# Patient Record
Sex: Female | Born: 1975 | Race: Black or African American | Hispanic: No | Marital: Single | Smoking: Never smoker
Health system: Southern US, Community
[De-identification: ages and names within clinical notes are randomized; demographics above are authoritative.]

## PROBLEM LIST (undated history)

## (undated) DIAGNOSIS — D1803 Hemangioma of intra-abdominal structures: Secondary | ICD-10-CM

## (undated) DIAGNOSIS — E278 Other specified disorders of adrenal gland: Secondary | ICD-10-CM

## (undated) HISTORY — PX: BREAST SURGERY: SHX581

## (undated) HISTORY — PX: APPENDECTOMY: SHX54

## (undated) HISTORY — PX: BILATERAL OOPHORECTOMY: SHX1221

## (undated) HISTORY — PX: ABDOMINAL HYSTERECTOMY: SHX81

## (undated) HISTORY — DX: Other specified disorders of adrenal gland: E27.8

---

## 2000-03-25 ENCOUNTER — Other Ambulatory Visit: Admission: RE | Admit: 2000-03-25 | Discharge: 2000-03-25 | Payer: Self-pay | Admitting: Internal Medicine

## 2000-12-02 ENCOUNTER — Encounter: Payer: Self-pay | Admitting: Family Medicine

## 2000-12-02 ENCOUNTER — Ambulatory Visit (HOSPITAL_COMMUNITY): Admission: RE | Admit: 2000-12-02 | Discharge: 2000-12-02 | Payer: Self-pay | Admitting: Family Medicine

## 2001-08-02 ENCOUNTER — Other Ambulatory Visit: Admission: RE | Admit: 2001-08-02 | Discharge: 2001-08-02 | Payer: Self-pay | Admitting: *Deleted

## 2001-09-20 ENCOUNTER — Other Ambulatory Visit: Admission: RE | Admit: 2001-09-20 | Discharge: 2001-09-20 | Payer: Self-pay | Admitting: *Deleted

## 2002-03-29 ENCOUNTER — Other Ambulatory Visit: Admission: RE | Admit: 2002-03-29 | Discharge: 2002-03-29 | Payer: Self-pay | Admitting: Family Medicine

## 2002-09-19 ENCOUNTER — Other Ambulatory Visit: Admission: RE | Admit: 2002-09-19 | Discharge: 2002-09-19 | Payer: Self-pay | Admitting: *Deleted

## 2003-05-01 ENCOUNTER — Other Ambulatory Visit: Admission: RE | Admit: 2003-05-01 | Discharge: 2003-05-01 | Payer: Self-pay | Admitting: Family Medicine

## 2003-10-09 ENCOUNTER — Other Ambulatory Visit: Admission: RE | Admit: 2003-10-09 | Discharge: 2003-10-09 | Payer: Self-pay | Admitting: *Deleted

## 2006-08-20 ENCOUNTER — Emergency Department (HOSPITAL_COMMUNITY): Admission: EM | Admit: 2006-08-20 | Discharge: 2006-08-20 | Payer: Self-pay | Admitting: Emergency Medicine

## 2012-08-11 ENCOUNTER — Other Ambulatory Visit: Payer: Self-pay | Admitting: Obstetrics

## 2012-08-11 DIAGNOSIS — N946 Dysmenorrhea, unspecified: Secondary | ICD-10-CM

## 2012-08-23 ENCOUNTER — Ambulatory Visit (HOSPITAL_COMMUNITY)
Admission: RE | Admit: 2012-08-23 | Discharge: 2012-08-23 | Disposition: A | Discharge status: Home / Self Care | Payer: 59 | Source: Ambulatory Visit | Attending: Obstetrics | Admitting: Obstetrics

## 2012-08-23 DIAGNOSIS — N946 Dysmenorrhea, unspecified: Secondary | ICD-10-CM | POA: Insufficient documentation

## 2012-08-23 DIAGNOSIS — D251 Intramural leiomyoma of uterus: Secondary | ICD-10-CM | POA: Insufficient documentation

## 2013-08-02 ENCOUNTER — Ambulatory Visit: Payer: Self-pay | Admitting: Obstetrics

## 2017-04-17 ENCOUNTER — Emergency Department (HOSPITAL_BASED_OUTPATIENT_CLINIC_OR_DEPARTMENT_OTHER)
Admission: EM | Admit: 2017-04-17 | Discharge: 2017-04-17 | Disposition: A | Discharge status: Home / Self Care | Payer: 59 | Attending: Emergency Medicine | Admitting: Emergency Medicine

## 2017-04-17 ENCOUNTER — Emergency Department (HOSPITAL_BASED_OUTPATIENT_CLINIC_OR_DEPARTMENT_OTHER): Payer: 59

## 2017-04-17 ENCOUNTER — Encounter (HOSPITAL_BASED_OUTPATIENT_CLINIC_OR_DEPARTMENT_OTHER): Payer: Self-pay | Admitting: Emergency Medicine

## 2017-04-17 DIAGNOSIS — R1013 Epigastric pain: Secondary | ICD-10-CM | POA: Diagnosis not present

## 2017-04-17 DIAGNOSIS — R072 Precordial pain: Secondary | ICD-10-CM | POA: Insufficient documentation

## 2017-04-17 DIAGNOSIS — R079 Chest pain, unspecified: Secondary | ICD-10-CM | POA: Diagnosis present

## 2017-04-17 LAB — COMPREHENSIVE METABOLIC PANEL
ALT: 18 U/L (ref 14–54)
AST: 21 U/L (ref 15–41)
Albumin: 4.1 g/dL (ref 3.5–5.0)
Alkaline Phosphatase: 60 U/L (ref 38–126)
Anion gap: 8 (ref 5–15)
BILIRUBIN TOTAL: 0.7 mg/dL (ref 0.3–1.2)
BUN: 8 mg/dL (ref 6–20)
CO2: 26 mmol/L (ref 22–32)
CREATININE: 0.75 mg/dL (ref 0.44–1.00)
Calcium: 8.9 mg/dL (ref 8.9–10.3)
Chloride: 103 mmol/L (ref 101–111)
GFR calc Af Amer: >60 mL/min (ref >60)
Glucose, Bld: 100 mg/dL — ABNORMAL HIGH (ref 65–99)
Potassium: 3.5 mmol/L (ref 3.5–5.1)
Sodium: 137 mmol/L (ref 135–145)
TOTAL PROTEIN: 7.7 g/dL (ref 6.5–8.1)

## 2017-04-17 LAB — CBC WITH DIFFERENTIAL/PLATELET
Basophils Absolute: 0 10*3/uL (ref 0.0–0.1)
Basophils Relative: 1 %
EOS ABS: 0.1 10*3/uL (ref 0.0–0.7)
EOS PCT: 3 %
HCT: 37.4 % (ref 36.0–46.0)
HEMOGLOBIN: 12.4 g/dL (ref 12.0–15.0)
LYMPHS ABS: 1.7 10*3/uL (ref 0.7–4.0)
LYMPHS PCT: 39 %
MCH: 27.2 pg (ref 26.0–34.0)
MCHC: 33.2 g/dL (ref 30.0–36.0)
MCV: 82 fL (ref 78.0–100.0)
MONOS PCT: 8 %
Monocytes Absolute: 0.3 10*3/uL (ref 0.1–1.0)
Neutro Abs: 2.2 10*3/uL (ref 1.7–7.7)
Neutrophils Relative %: 49 %
PLATELETS: 330 10*3/uL (ref 150–400)
RBC: 4.56 MIL/uL (ref 3.87–5.11)
RDW: 12.9 % (ref 11.5–15.5)
WBC: 4.4 10*3/uL (ref 4.0–10.5)

## 2017-04-17 LAB — D-DIMER, QUANTITATIVE (NOT AT ARMC): D DIMER QUANT: 1.08 ug{FEU}/mL — AB (ref 0.00–0.50)

## 2017-04-17 LAB — LIPASE, BLOOD: LIPASE: 22 U/L (ref 11–51)

## 2017-04-17 LAB — TROPONIN I

## 2017-04-17 MED ORDER — SODIUM CHLORIDE 0.9 % IV SOLN: INTRAVENOUS | Status: DC

## 2017-04-17 MED ORDER — HYDROCODONE-ACETAMINOPHEN 5-325 MG PO TABS: 1.0000 | ORAL_TABLET | Freq: Four times a day (QID) | ORAL | 0 refills | Status: DC | PRN

## 2017-04-17 MED ORDER — SODIUM CHLORIDE 0.9 % IV SOLN
INTRAVENOUS | Status: DC
  Administered 2017-04-17: 13:00:00 via INTRAVENOUS

## 2017-04-17 MED ORDER — IOPAMIDOL (ISOVUE-370) INJECTION 76%
100.0000 mL | Freq: Once | INTRAVENOUS | Status: AC | PRN
  Administered 2017-04-17: 100 mL via INTRAVENOUS

## 2017-04-17 MED ORDER — SODIUM CHLORIDE 0.9 % IV BOLUS (SEPSIS)
500.0000 mL | Freq: Once | INTRAVENOUS | Status: AC
  Administered 2017-04-17: 500 mL via INTRAVENOUS

## 2017-04-17 MED ORDER — SODIUM CHLORIDE 0.9 % IV BOLUS (SEPSIS): 250.0000 mL | Freq: Once | INTRAVENOUS | Status: DC

## 2017-04-17 NOTE — ED Notes (Signed)
Patient transported to CT 

## 2017-04-17 NOTE — Discharge Instructions (Signed)
Follow-up with your doctor have them follow up the hemangioma the liver. Not clear whether just related to any other symptoms today. No signs of blood clots in the chest. No signs of any acute heart problem. Take the hydrocodone as needed. Return for any new or worse symptoms.

## 2017-04-17 NOTE — ED Triage Notes (Signed)
Pain under L breast/rib cage  x 1-2 weeks, dizziness, SOB with exertion, tingling to L arm. Pt attributed it to exhaustion, has been working a lot of hours. Pt seen at [facility] this morning and sent for further eval

## 2017-04-17 NOTE — ED Notes (Signed)
Vital signs stable. 

## 2017-04-17 NOTE — ED Notes (Signed)
ED Provider at bedside. 

## 2017-04-17 NOTE — ED Provider Notes (Signed)
[facility] Provider Note   CSN: [id number] Arrival date & time: 04/17/17  9767     History   Chief Complaint Chief Complaint  Patient presents with  . Chest Pain    HPI Caitlin Crane is a 41 y.o. female.  Patient with a two-week history of some dizziness non-room spinning left-sided chest pain that radiates to left arm. Shortness of breath with exertion. Epigastric abdominal pain. Patient feels it may be related to too much stress at work. Patient went to urgent care and was referred here for further evaluation.      History reviewed. No pertinent past medical history.  There are no active problems to display for this patient.   Past Surgical History:  Procedure Laterality Date  . ABDOMINAL HYSTERECTOMY    . BREAST SURGERY      OB History    No data available       Home Medications    Prior to Admission medications   Medication Sig Start Date End Date Taking? Authorizing Provider  HYDROcodone-acetaminophen (NORCO/VICODIN) 5-325 MG tablet Take 1-2 tablets by mouth every 6 (six) hours as needed for moderate pain. 04/17/17   Fredia Sorrow, MD    Family History No family history on file.  Social History Social History  Substance Use Topics  . Smoking status: Never Smoker  . Smokeless tobacco: Never Used  . Alcohol use No     Allergies   Patient has no known allergies.   Review of Systems Review of Systems  Constitutional: Negative for fever.  HENT: Negative for congestion.   Eyes: Negative for visual disturbance.  Respiratory: Positive for shortness of breath.   Gastrointestinal: Positive for abdominal pain.  Genitourinary: Negative for dysuria.  Musculoskeletal: Negative for myalgias.  Skin: Negative for rash.  Neurological: Negative for headaches.  Hematological: Does not bruise/bleed easily.  Psychiatric/Behavioral: Negative for confusion.     Physical Exam Updated Vital Signs BP (!) 152/89   Pulse 76   Temp 98.8  F (37.1 C) (Oral)   Resp 13   Ht 1.645 m (5' 4.75")   Wt 94.3 kg (208 lb)   SpO2 100%   BMI 34.88 kg/m   Physical Exam  Constitutional: She is oriented to person, place, and time. She appears well-developed and well-nourished. No distress.  HENT:  Head: Normocephalic and atraumatic.  Mouth/Throat: Oropharynx is clear and moist.  Eyes: Pupils are equal, round, and reactive to light. Conjunctivae and EOM are normal.  Neck: Normal range of motion. Neck supple.  Cardiovascular: Normal rate and regular rhythm.   Pulmonary/Chest: Effort normal and breath sounds normal. No respiratory distress.  Abdominal: Soft. Bowel sounds are normal. There is no tenderness.  Musculoskeletal: Normal range of motion. She exhibits no edema.  Neurological: She is alert and oriented to person, place, and time. No cranial nerve deficit or sensory deficit. She exhibits normal muscle tone. Coordination normal.  Skin: Skin is warm. No rash noted.  Nursing note and vitals reviewed.    ED Treatments / Results  Labs (all labs ordered are listed, but only abnormal results are displayed) Labs Reviewed  COMPREHENSIVE METABOLIC PANEL - Abnormal; Notable for the following:       Result Value   Glucose, Bld 100 (*)    All other components within normal limits  D-DIMER, QUANTITATIVE (NOT AT [facility]) - Abnormal; Notable for the following:    D-Dimer, Quant 1.08 (*)    All other components within normal limits  CBC WITH DIFFERENTIAL/PLATELET  TROPONIN I  LIPASE, BLOOD    EKG  EKG Interpretation  Date/Time:  Friday April 17 2017 09:58:57 EDT Ventricular Rate:  77 PR Interval:    QRS Duration: 79 QT Interval:  402 QTC Calculation: 455 R Axis:   78 Text Interpretation:  Sinus rhythm Consider left ventricular hypertrophy Confirmed by Fredia Sorrow [phone]) on 04/17/2017 10:02:32 AM Also confirmed by Fredia Sorrow [phone]), editor Drema Pry [phone])  on 04/17/2017 10:39:37 AM       Radiology Dg  Chest 2 View  Result Date: 04/17/2017 CLINICAL DATA:  Chest pain EXAM: CHEST  2 VIEW COMPARISON:  None. FINDINGS: Heart and mediastinal contours are within normal limits. No focal opacities or effusions. No acute bony abnormality. IMPRESSION: No active cardiopulmonary disease. Electronically Signed   By: Rolm Baptise M.D.   On: 04/17/2017 10:17   Ct Angio Chest Pe W/cm &/or Wo Cm  Result Date: 04/17/2017 CLINICAL DATA:  Right upper abdominal pain for 1-2 weeks. Chest pain. EXAM: CT ANGIOGRAPHY CHEST CT ABDOMEN AND PELVIS WITH CONTRAST TECHNIQUE: Multidetector CT imaging of the chest was performed using the standard protocol during bolus administration of intravenous contrast. Multiplanar CT image reconstructions and MIPs were obtained to evaluate the vascular anatomy. Multidetector CT imaging of the abdomen and pelvis was performed using the standard protocol during bolus administration of intravenous contrast. CONTRAST:  100 mL Isovue 370 COMPARISON:  None. FINDINGS: CTA CHEST FINDINGS Cardiovascular: Satisfactory opacification of the pulmonary arteries to the segmental level. No evidence of pulmonary embolism. Normal heart size. No pericardial effusion. Normal caliber thoracic aorta. No thoracic aortic aneurysm. No thoracic aortic dissection. Mediastinum/Nodes: No enlarged mediastinal, hilar, or axillary lymph nodes. Thyroid gland, trachea, and esophagus demonstrate no significant findings. Lungs/Pleura: Lungs are clear. No pleural effusion or pneumothorax. Musculoskeletal: No chest wall abnormality. No acute or significant osseous findings. Review of the MIP images confirms the above findings. CT ABDOMEN and PELVIS FINDINGS Hepatobiliary: 10.6 x 9.8 cm left hepatic mass with peripheral nodular enhancement encompassing nearly the entirety of the left hepatic lobe most consistent with a giant hemangioma. 6.2 x 5.8 cm right hepatic mass with peripheral nodular enhancement most consistent with a hemangioma.  Normal gallbladder. No intrahepatic or extrahepatic biliary ductal dilatation. Pancreas: Unremarkable. No pancreatic ductal dilatation or surrounding inflammatory changes. Spleen: Normal in size without focal abnormality. Adrenals/Urinary Tract: Adrenal glands are unremarkable. Kidneys are normal, without renal calculi, focal lesion, or hydronephrosis. Bladder is unremarkable. Stomach/Bowel: Small hiatal hernia. Stomach is otherwise within normal limits. Appendix appears normal. No evidence of bowel wall thickening, distention, or inflammatory changes. Diverticulosis without evidence diverticulitis. Vascular/Lymphatic: Normal caliber abdominal aorta. No lymphadenopathy. Reproductive: Status post hysterectomy. No adnexal masses. Other: No abdominal wall hernia or abnormality. No abdominopelvic ascites. Musculoskeletal: No acute osseous abnormality. No lytic or sclerotic osseous lesion. Review of the MIP images confirms the above findings. IMPRESSION: 1. No pulmonary embolus. 2. No acute cardiopulmonary disease. 3. No acute abdominal or pelvic pathology. 4. Giant left hepatic lobe hemangioma encompassing nearly the entirety of the left hepatic lobe. 5. 6.2 x 5.8 cm hemangioma in the right hepatic lobe. Electronically Signed   By: Kathreen Devoid   On: 04/17/2017 12:59   Ct Abdomen Pelvis W Contrast  Result Date: 04/17/2017 CLINICAL DATA:  Right upper abdominal pain for 1-2 weeks. Chest pain. EXAM: CT ANGIOGRAPHY CHEST CT ABDOMEN AND PELVIS WITH CONTRAST TECHNIQUE: Multidetector CT imaging of the chest was performed using the standard protocol during bolus administration of intravenous contrast. Multiplanar  CT image reconstructions and MIPs were obtained to evaluate the vascular anatomy. Multidetector CT imaging of the abdomen and pelvis was performed using the standard protocol during bolus administration of intravenous contrast. CONTRAST:  100 mL Isovue 370 COMPARISON:  None. FINDINGS: CTA CHEST FINDINGS  Cardiovascular: Satisfactory opacification of the pulmonary arteries to the segmental level. No evidence of pulmonary embolism. Normal heart size. No pericardial effusion. Normal caliber thoracic aorta. No thoracic aortic aneurysm. No thoracic aortic dissection. Mediastinum/Nodes: No enlarged mediastinal, hilar, or axillary lymph nodes. Thyroid gland, trachea, and esophagus demonstrate no significant findings. Lungs/Pleura: Lungs are clear. No pleural effusion or pneumothorax. Musculoskeletal: No chest wall abnormality. No acute or significant osseous findings. Review of the MIP images confirms the above findings. CT ABDOMEN and PELVIS FINDINGS Hepatobiliary: 10.6 x 9.8 cm left hepatic mass with peripheral nodular enhancement encompassing nearly the entirety of the left hepatic lobe most consistent with a giant hemangioma. 6.2 x 5.8 cm right hepatic mass with peripheral nodular enhancement most consistent with a hemangioma. Normal gallbladder. No intrahepatic or extrahepatic biliary ductal dilatation. Pancreas: Unremarkable. No pancreatic ductal dilatation or surrounding inflammatory changes. Spleen: Normal in size without focal abnormality. Adrenals/Urinary Tract: Adrenal glands are unremarkable. Kidneys are normal, without renal calculi, focal lesion, or hydronephrosis. Bladder is unremarkable. Stomach/Bowel: Small hiatal hernia. Stomach is otherwise within normal limits. Appendix appears normal. No evidence of bowel wall thickening, distention, or inflammatory changes. Diverticulosis without evidence diverticulitis. Vascular/Lymphatic: Normal caliber abdominal aorta. No lymphadenopathy. Reproductive: Status post hysterectomy. No adnexal masses. Other: No abdominal wall hernia or abnormality. No abdominopelvic ascites. Musculoskeletal: No acute osseous abnormality. No lytic or sclerotic osseous lesion. Review of the MIP images confirms the above findings. IMPRESSION: 1. No pulmonary embolus. 2. No acute  cardiopulmonary disease. 3. No acute abdominal or pelvic pathology. 4. Giant left hepatic lobe hemangioma encompassing nearly the entirety of the left hepatic lobe. 5. 6.2 x 5.8 cm hemangioma in the right hepatic lobe. Electronically Signed   By: Kathreen Devoid   On: 04/17/2017 12:59    Procedures Procedures (including critical care time)  Medications Ordered in ED Medications  0.9 %  sodium chloride infusion ( Intravenous New Bag/Given 04/17/17 1234)  sodium chloride 0.9 % bolus 500 mL (0 mLs Intravenous Stopped 04/17/17 1211)  iopamidol (ISOVUE-370) 76 % injection 100 mL (100 mLs Intravenous Contrast Given 04/17/17 1214)     Initial Impression / Assessment and Plan / ED Course  I have reviewed the triage vital signs and the nursing notes.  Pertinent labs & imaging results that were available during my care of the patient were reviewed by me and considered in my medical decision making (see chart for details).     Patient with a complaint of some left-sided chest pain epigastric abdominal pain. Patient referred here from urgent care. Symptoms been ongoing for about 2 weeks. Shortness of breath with exertion as well. Does have some radiation to left arm.  Extensive workup here chest x-ray negative. D-dimer was elevated so CT angios chest was done without evidence of pulmonary embolus or any other acute pulmonary findings. Due to the abdominal pain patient underwent CT abdomen and pelvis with the only finding being a 5 cm hemangioma left lobe of the liver. Possibly could explain some of the epigastric pain most likely is been there for longer than 2 weeks. This is most likely a benign finding. Patient made aware of it and will follow with primary care doctor. Labs otherwise without any significant abnormalities. Troponin was  negative. EKG without acute findings.  In addition the workup for the epigastric abdominal pain lipase was normal liver function tests without significant abnormalities. And  CT only showed the hemangioma of the left lobe of the liver.  Final Clinical Impressions(s) / ED Diagnoses   Final diagnoses:  Precordial pain  Epigastric pain    New Prescriptions New Prescriptions   HYDROCODONE-ACETAMINOPHEN (NORCO/VICODIN) 5-325 MG TABLET    Take 1-2 tablets by mouth every 6 (six) hours as needed for moderate pain.     Fredia Sorrow, MD 04/17/17 1359

## 2017-04-17 NOTE — ED Notes (Signed)
Patient transported to X-ray 

## 2017-07-20 ENCOUNTER — Encounter (HOSPITAL_COMMUNITY)
Admission: EM | Disposition: A | Discharge status: Home / Self Care | Payer: Self-pay | Source: Home / Self Care | Attending: Surgery

## 2017-07-20 ENCOUNTER — Emergency Department (HOSPITAL_BASED_OUTPATIENT_CLINIC_OR_DEPARTMENT_OTHER): Payer: 59

## 2017-07-20 ENCOUNTER — Observation Stay (HOSPITAL_BASED_OUTPATIENT_CLINIC_OR_DEPARTMENT_OTHER)
Admission: EM | Admit: 2017-07-20 | Discharge: 2017-07-21 | Disposition: A | Discharge status: Home / Self Care | Payer: 59 | Attending: Surgery | Admitting: Surgery

## 2017-07-20 ENCOUNTER — Inpatient Hospital Stay: Admit: 2017-07-20 | Payer: 59 | Admitting: Surgery

## 2017-07-20 ENCOUNTER — Emergency Department (HOSPITAL_COMMUNITY): Payer: 59 | Admitting: Anesthesiology

## 2017-07-20 ENCOUNTER — Encounter (HOSPITAL_BASED_OUTPATIENT_CLINIC_OR_DEPARTMENT_OTHER): Payer: Self-pay | Admitting: Emergency Medicine

## 2017-07-20 DIAGNOSIS — K381 Appendicular concretions: Secondary | ICD-10-CM | POA: Diagnosis not present

## 2017-07-20 DIAGNOSIS — K3533 Acute appendicitis with perforation and localized peritonitis, with abscess: Secondary | ICD-10-CM | POA: Diagnosis not present

## 2017-07-20 DIAGNOSIS — D1809 Hemangioma of other sites: Secondary | ICD-10-CM | POA: Insufficient documentation

## 2017-07-20 DIAGNOSIS — N83521 Torsion of right fallopian tube: Secondary | ICD-10-CM | POA: Diagnosis present

## 2017-07-20 DIAGNOSIS — K3580 Unspecified acute appendicitis: Secondary | ICD-10-CM

## 2017-07-20 DIAGNOSIS — Z6835 Body mass index (BMI) 35.0-35.9, adult: Secondary | ICD-10-CM | POA: Insufficient documentation

## 2017-07-20 DIAGNOSIS — E669 Obesity, unspecified: Secondary | ICD-10-CM | POA: Insufficient documentation

## 2017-07-20 DIAGNOSIS — Z9049 Acquired absence of other specified parts of digestive tract: Secondary | ICD-10-CM

## 2017-07-20 HISTORY — PX: LAPAROSCOPIC APPENDECTOMY: SHX408

## 2017-07-20 LAB — URINALYSIS, ROUTINE W REFLEX MICROSCOPIC
BILIRUBIN URINE: NEGATIVE
Glucose, UA: NEGATIVE mg/dL
KETONES UR: NEGATIVE mg/dL
LEUKOCYTES UA: NEGATIVE
Nitrite: NEGATIVE
PROTEIN: NEGATIVE mg/dL
Specific Gravity, Urine: <1.005 — ABNORMAL LOW (ref 1.005–1.030)
pH: 6.5 (ref 5.0–8.0)

## 2017-07-20 LAB — CBC
HEMATOCRIT: 35.5 % — AB (ref 36.0–46.0)
HEMOGLOBIN: 11.7 g/dL — AB (ref 12.0–15.0)
MCH: 26.9 pg (ref 26.0–34.0)
MCHC: 33 g/dL (ref 30.0–36.0)
MCV: 81.6 fL (ref 78.0–100.0)
Platelets: 379 10*3/uL (ref 150–400)
RBC: 4.35 MIL/uL (ref 3.87–5.11)
RDW: 12.9 % (ref 11.5–15.5)
WBC: 6 10*3/uL (ref 4.0–10.5)

## 2017-07-20 LAB — COMPREHENSIVE METABOLIC PANEL
ALBUMIN: 3.9 g/dL (ref 3.5–5.0)
ALK PHOS: 67 U/L (ref 38–126)
ALT: 42 U/L (ref 14–54)
AST: 29 U/L (ref 15–41)
Anion gap: 9 (ref 5–15)
BILIRUBIN TOTAL: 0.4 mg/dL (ref 0.3–1.2)
BUN: 9 mg/dL (ref 6–20)
CALCIUM: 8.9 mg/dL (ref 8.9–10.3)
CO2: 25 mmol/L (ref 22–32)
CREATININE: 0.69 mg/dL (ref 0.44–1.00)
Chloride: 102 mmol/L (ref 101–111)
GFR calc Af Amer: >60 mL/min (ref >60)
GFR calc non Af Amer: >60 mL/min (ref >60)
GLUCOSE: 92 mg/dL (ref 65–99)
Potassium: 3.6 mmol/L (ref 3.5–5.1)
Sodium: 136 mmol/L (ref 135–145)
TOTAL PROTEIN: 8.1 g/dL (ref 6.5–8.1)

## 2017-07-20 LAB — URINALYSIS, MICROSCOPIC (REFLEX)

## 2017-07-20 LAB — LIPASE, BLOOD: LIPASE: 21 U/L (ref 11–51)

## 2017-07-20 SURGERY — APPENDECTOMY, LAPAROSCOPIC
Anesthesia: General | Site: Abdomen | Laterality: Right

## 2017-07-20 MED ORDER — KETOROLAC TROMETHAMINE 30 MG/ML IJ SOLN: 30.0000 mg | Freq: Once | INTRAMUSCULAR | Status: DC | PRN

## 2017-07-20 MED ORDER — SODIUM CHLORIDE 0.9 % IV BOLUS (SEPSIS)
1000.0000 mL | Freq: Once | INTRAVENOUS | Status: AC
  Administered 2017-07-20: 1000 mL via INTRAVENOUS

## 2017-07-20 MED ORDER — HYDROMORPHONE HCL-NACL 0.5-0.9 MG/ML-% IV SOSY
PREFILLED_SYRINGE | INTRAVENOUS | Status: AC
  Administered 2017-07-20: 0.5 mg via INTRAVENOUS
  Filled 2017-07-20: qty 2

## 2017-07-20 MED ORDER — ONDANSETRON HCL 4 MG/2ML IJ SOLN
INTRAMUSCULAR | Status: AC
  Administered 2017-07-20: 4 mg via INTRAVENOUS
  Filled 2017-07-20: qty 2

## 2017-07-20 MED ORDER — PROMETHAZINE HCL 25 MG/ML IJ SOLN
INTRAMUSCULAR | Status: AC
  Filled 2017-07-20: qty 1

## 2017-07-20 MED ORDER — MIDAZOLAM HCL 2 MG/2ML IJ SOLN
INTRAMUSCULAR | Status: AC
  Filled 2017-07-20: qty 2

## 2017-07-20 MED ORDER — LIDOCAINE 2% (20 MG/ML) 5 ML SYRINGE
INTRAMUSCULAR | Status: DC | PRN
  Administered 2017-07-20: 100 mg via INTRAVENOUS

## 2017-07-20 MED ORDER — SUGAMMADEX SODIUM 200 MG/2ML IV SOLN
INTRAVENOUS | Status: DC | PRN
  Administered 2017-07-20: 200 mg via INTRAVENOUS

## 2017-07-20 MED ORDER — LACTATED RINGERS IR SOLN
Status: DC | PRN
  Administered 2017-07-20: 1000 mL

## 2017-07-20 MED ORDER — BUPIVACAINE-EPINEPHRINE 0.25% -1:200000 IJ SOLN
INTRAMUSCULAR | Status: AC
  Filled 2017-07-20: qty 1

## 2017-07-20 MED ORDER — PROMETHAZINE HCL 25 MG/ML IJ SOLN: 6.2500 mg | INTRAMUSCULAR | Status: DC | PRN

## 2017-07-20 MED ORDER — DEXAMETHASONE SODIUM PHOSPHATE 10 MG/ML IJ SOLN
INTRAMUSCULAR | Status: AC
  Filled 2017-07-20: qty 1

## 2017-07-20 MED ORDER — KCL IN DEXTROSE-NACL 20-5-0.45 MEQ/L-%-% IV SOLN
INTRAVENOUS | Status: DC
  Administered 2017-07-20 – 2017-07-21 (×2): via INTRAVENOUS
  Filled 2017-07-20 (×2): qty 1000

## 2017-07-20 MED ORDER — ROCURONIUM BROMIDE 10 MG/ML (PF) SYRINGE
PREFILLED_SYRINGE | INTRAVENOUS | Status: DC | PRN
  Administered 2017-07-20: 30 mg via INTRAVENOUS

## 2017-07-20 MED ORDER — PROPOFOL 10 MG/ML IV BOLUS
INTRAVENOUS | Status: AC
  Filled 2017-07-20: qty 20

## 2017-07-20 MED ORDER — BUPIVACAINE-EPINEPHRINE 0.25% -1:200000 IJ SOLN
INTRAMUSCULAR | Status: DC | PRN
  Administered 2017-07-20: 30 mL

## 2017-07-20 MED ORDER — DEXAMETHASONE SODIUM PHOSPHATE 10 MG/ML IJ SOLN
INTRAMUSCULAR | Status: DC | PRN
  Administered 2017-07-20: 10 mg via INTRAVENOUS

## 2017-07-20 MED ORDER — METRONIDAZOLE IN NACL 5-0.79 MG/ML-% IV SOLN
500.0000 mg | Freq: Once | INTRAVENOUS | Status: AC
  Administered 2017-07-20: 500 mg via INTRAVENOUS
  Filled 2017-07-20: qty 100

## 2017-07-20 MED ORDER — SUCCINYLCHOLINE CHLORIDE 200 MG/10ML IV SOSY
PREFILLED_SYRINGE | INTRAVENOUS | Status: DC | PRN
  Administered 2017-07-20: 140 mg via INTRAVENOUS

## 2017-07-20 MED ORDER — MEPERIDINE HCL 50 MG/ML IJ SOLN
INTRAMUSCULAR | Status: AC
Start: 2017-07-20 — End: 2017-07-20
  Administered 2017-07-20: 6.25 mg via INTRAVENOUS
  Filled 2017-07-20: qty 1

## 2017-07-20 MED ORDER — ONDANSETRON 4 MG PO TBDP: 4.0000 mg | ORAL_TABLET | Freq: Four times a day (QID) | ORAL | Status: DC | PRN

## 2017-07-20 MED ORDER — 0.9 % SODIUM CHLORIDE (POUR BTL) OPTIME
TOPICAL | Status: DC | PRN
  Administered 2017-07-20: 1000 mL

## 2017-07-20 MED ORDER — HYDROCODONE-ACETAMINOPHEN 5-325 MG PO TABS
1.0000 | ORAL_TABLET | ORAL | Status: DC | PRN
  Administered 2017-07-20 – 2017-07-21 (×3): 2 via ORAL
  Filled 2017-07-20 (×3): qty 2

## 2017-07-20 MED ORDER — MIDAZOLAM HCL 5 MG/5ML IJ SOLN
INTRAMUSCULAR | Status: DC | PRN
  Administered 2017-07-20: 2 mg via INTRAVENOUS

## 2017-07-20 MED ORDER — HYDROMORPHONE HCL 1 MG/ML IJ SOLN
1.0000 mg | Freq: Once | INTRAMUSCULAR | Status: AC
  Administered 2017-07-20: 1 mg via INTRAVENOUS
  Filled 2017-07-20: qty 1

## 2017-07-20 MED ORDER — HYDROMORPHONE HCL-NACL 0.5-0.9 MG/ML-% IV SOSY
0.2500 mg | PREFILLED_SYRINGE | INTRAVENOUS | Status: DC | PRN
  Administered 2017-07-20 (×2): 0.5 mg via INTRAVENOUS

## 2017-07-20 MED ORDER — CEFTRIAXONE SODIUM 2 G IJ SOLR
2.0000 g | Freq: Once | INTRAMUSCULAR | Status: AC
  Administered 2017-07-20: 2 g via INTRAVENOUS
  Filled 2017-07-20: qty 2

## 2017-07-20 MED ORDER — HEPARIN SODIUM (PORCINE) 5000 UNIT/ML IJ SOLN
5000.0000 [IU] | Freq: Three times a day (TID) | INTRAMUSCULAR | Status: DC
  Administered 2017-07-20 – 2017-07-21 (×2): 5000 [IU] via SUBCUTANEOUS
  Filled 2017-07-20 (×2): qty 1

## 2017-07-20 MED ORDER — PROPOFOL 10 MG/ML IV BOLUS
INTRAVENOUS | Status: DC | PRN
  Administered 2017-07-20: 180 mg via INTRAVENOUS

## 2017-07-20 MED ORDER — ONDANSETRON 8 MG PO TBDP
8.0000 mg | ORAL_TABLET | Freq: Once | ORAL | Status: AC
  Administered 2017-07-20: 8 mg via ORAL
  Filled 2017-07-20: qty 1

## 2017-07-20 MED ORDER — FENTANYL CITRATE (PF) 100 MCG/2ML IJ SOLN
INTRAMUSCULAR | Status: AC
  Filled 2017-07-20: qty 2

## 2017-07-20 MED ORDER — IOPAMIDOL (ISOVUE-300) INJECTION 61%
100.0000 mL | Freq: Once | INTRAVENOUS | Status: AC | PRN
  Administered 2017-07-20: 100 mL via INTRAVENOUS

## 2017-07-20 MED ORDER — MORPHINE SULFATE (PF) 2 MG/ML IV SOLN
1.0000 mg | INTRAVENOUS | Status: DC | PRN
  Administered 2017-07-20: 2 mg via INTRAVENOUS
  Filled 2017-07-20: qty 1

## 2017-07-20 MED ORDER — ONDANSETRON HCL 4 MG/2ML IJ SOLN
4.0000 mg | Freq: Once | INTRAMUSCULAR | Status: AC
  Administered 2017-07-20: 4 mg via INTRAVENOUS

## 2017-07-20 MED ORDER — ONDANSETRON HCL 4 MG/2ML IJ SOLN: 4.0000 mg | Freq: Four times a day (QID) | INTRAMUSCULAR | Status: DC | PRN

## 2017-07-20 MED ORDER — MEPERIDINE HCL 50 MG/ML IJ SOLN
6.2500 mg | INTRAMUSCULAR | Status: DC | PRN
  Administered 2017-07-20: 6.25 mg via INTRAVENOUS

## 2017-07-20 MED ORDER — FENTANYL CITRATE (PF) 100 MCG/2ML IJ SOLN
INTRAMUSCULAR | Status: DC | PRN
  Administered 2017-07-20 (×2): 50 ug via INTRAVENOUS

## 2017-07-20 MED ORDER — LACTATED RINGERS IV SOLN
INTRAVENOUS | Status: DC | PRN
  Administered 2017-07-20: 14:00:00 via INTRAVENOUS

## 2017-07-20 SURGICAL SUPPLY — 42 items
ADH SKN CLS APL DERMABOND .7 (GAUZE/BANDAGES/DRESSINGS)
APL SKNCLS STERI-STRIP NONHPOA (GAUZE/BANDAGES/DRESSINGS)
APPLIER CLIP ROT 10 11.4 M/L (STAPLE)
APR CLP MED LRG 11.4X10 (STAPLE)
BAG SPEC RTRVL 10 TROC 200 (ENDOMECHANICALS) ×1
BAG SPEC RTRVL LRG 6X4 10 (ENDOMECHANICALS)
BENZOIN TINCTURE PRP APPL 2/3 (GAUZE/BANDAGES/DRESSINGS) IMPLANT
CABLE HIGH FREQUENCY MONO STRZ (ELECTRODE) ×3 IMPLANT
CHLORAPREP W/TINT 26ML (MISCELLANEOUS) ×3 IMPLANT
CLIP APPLIE ROT 10 11.4 M/L (STAPLE) IMPLANT
CLOSURE WOUND 1/2 X4 (GAUZE/BANDAGES/DRESSINGS)
COVER SURGICAL LIGHT HANDLE (MISCELLANEOUS) ×3 IMPLANT
CUTTER FLEX LINEAR 45M (STAPLE) IMPLANT
DECANTER SPIKE VIAL GLASS SM (MISCELLANEOUS) ×3 IMPLANT
DERMABOND ADVANCED (GAUZE/BANDAGES/DRESSINGS)
DERMABOND ADVANCED .7 DNX12 (GAUZE/BANDAGES/DRESSINGS) ×1 IMPLANT
DRAPE LAPAROSCOPIC ABDOMINAL (DRAPES) ×3 IMPLANT
ELECT REM PT RETURN 15FT ADLT (MISCELLANEOUS) ×3 IMPLANT
ENDOLOOP SUT PDS II  0 18 (SUTURE)
ENDOLOOP SUT PDS II 0 18 (SUTURE) IMPLANT
GLOVE SURG SIGNA 7.5 PF LTX (GLOVE) ×3 IMPLANT
GOWN STRL REUS W/TWL XL LVL3 (GOWN DISPOSABLE) ×6 IMPLANT
KIT BASIN OR (CUSTOM PROCEDURE TRAY) ×3 IMPLANT
POUCH RETRIEVAL ECOSAC 10 (ENDOMECHANICALS) IMPLANT
POUCH RETRIEVAL ECOSAC 10MM (ENDOMECHANICALS) ×2
POUCH SPECIMEN RETRIEVAL 10MM (ENDOMECHANICALS) ×1 IMPLANT
RELOAD 45 VASCULAR/THIN (ENDOMECHANICALS) IMPLANT
RELOAD STAPLE 45 2.5 WHT GRN (ENDOMECHANICALS) IMPLANT
RELOAD STAPLE 45 3.5 BLU ETS (ENDOMECHANICALS) IMPLANT
RELOAD STAPLE TA45 3.5 REG BLU (ENDOMECHANICALS) ×3 IMPLANT
SCISSORS LAP 5X35 DISP (ENDOMECHANICALS) ×3 IMPLANT
SET IRRIG TUBING LAPAROSCOPIC (IRRIGATION / IRRIGATOR) ×3 IMPLANT
SHEARS HARMONIC ACE PLUS 36CM (ENDOMECHANICALS) ×3 IMPLANT
SLEEVE XCEL OPT CAN 5 100 (ENDOMECHANICALS) ×3 IMPLANT
STRIP CLOSURE SKIN 1/2X4 (GAUZE/BANDAGES/DRESSINGS) IMPLANT
SUT MNCRL AB 4-0 PS2 18 (SUTURE) ×3 IMPLANT
SUT VIC AB 2-0 SH 18 (SUTURE) IMPLANT
TOWEL OR 17X26 10 PK STRL BLUE (TOWEL DISPOSABLE) ×3 IMPLANT
TOWEL OR NON WOVEN STRL DISP B (DISPOSABLE) ×3 IMPLANT
TRAY LAPAROSCOPIC (CUSTOM PROCEDURE TRAY) ×3 IMPLANT
TROCAR BLADELESS OPT 5 100 (ENDOMECHANICALS) ×3 IMPLANT
TROCAR XCEL BLUNT TIP 100MML (ENDOMECHANICALS) ×3 IMPLANT

## 2017-07-20 NOTE — Op Note (Signed)
Re:   Caitlin Crane DOB:   05/26/1976 MRN:   [id number]                   FACILITY:  [facility]  DATE OF PROCEDURE: 07/20/2017                              OPERATIVE REPORT  PREOPERATIVE DIAGNOSIS:  Appendicitis  POSTOPERATIVE DIAGNOSIS:  Torsion of the right fallopian tube, appendicitis of the tip of the appendix - probably secondary to being stuck to the right fallopian tube. Hemangiomas of both lobes of the liver  [photos in paper chart and at the end of this note]  PROCEDURE:  Laparoscopic right salpingectomy, laparoscopic appendectomy.  SURGEON:  Fenton Malling. Lucia Gaskins, MD  ASSISTANT:  No first assistant.  ANESTHESIA:  General endotracheal.  Anesthesiologist: Audry Pili, MD CRNA: Noralyn Pick D, CRNA  ASA:  2E  ESTIMATED BLOOD LOSS:  Minimal.  DRAINS: none   SPECIMEN:   Appendix  COUNTS CORRECT:  YES  INDICATIONS FOR PROCEDURE: Caitlin Crane is a 41 y.o. (DOB: 09-Jul-1976) AA female whose primary care doctor is Roque Cash., MD and comes to the OR for an appendectomy.   I discussed with the patient, the indications and potential complications of appendiceal surgery.  The potential complications include, but are not limited to, bleeding, open surgery, bowel resection, and the possibility of another diagnosis.  OPERATIVE NOTE:  The patient underwent a general endotracheal anesthetic as supervised by Anesthesiologist: Audry Pili, MD CRNA: Marijo Conception, CRNA, General, in [place] room #4.  The patient was given Rocephin and Flagyl prior the beginning of the procedure and the abdomen was prepped with ChloraPrep.   A time-out was held and surgical checklist run.  An infraumbilical incision was made with sharp dissection carried down to the abdominal cavity.  An 12 mm Hasson trocar was inserted through the infraumbilical incision and into the peritoneal cavity.  A 30 degree 5 mm laparoscope was inserted through a 12 mm Hasson trocar and the Hasson trocar secured  with a 0 Vicryl suture.  I placed a 5 mm trocar in the right upper quadrant and a 5 mm torcar in left lower quadrant and did abdominal exploration.    The right and left lobes of liver showed evidence of hemangiomas seen on CT scan.  Photos were taken and placed in the paper chart.  Stomach was unremarkable.    In the pelvis, beneath the right ovary, the patient had a hemorrhagic necrotic mass approximately 4 x 5 cm.  I could see were the blood supply to this mass was twisted. Photos were taken and placed in the paper chart.  I was able to dissecting free up this mass from behind the right ovary. 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  I placed the mass in a bag and removed it through the umbilicus.  The tip of the patient's appendix lay over this hemosalpinx. There was inflammation at the tip of the appendix, but I thought this was probably more reactive than primary appendicitis.  The appendix was not ruptured.  I thought it best to go ahead and remove the appendix.  The mesentery of the appendix was divided with a Harmonic scalpel.  I got to the base of the appendix.  I then used a blue load 45 mm Ethicon Endo-GIA stapler and fired this across the base  of the appendix.  I placed the appendix in EndoCatch bag and delivered the bag through the umbilical incision.  I irrigated the abdomen with 800 cc of saline.  After irrigating the abdomen, I then removed the trocars, in turn.  The umbilical port fascia was closed with 0 Vicryl suture.   I closed the skin each site with a 4-0 Monocryl suture and painted the wounds with DermaBond.  I then injected a total of 30 mL of 0.25% Marcaine at the incisions.  Sponge and needle count were correct at the end of the case.  The patient was transferred to the recovery room in good condition.  The patient tolerated the procedure well and it depends on the patient's post op clinical course as to when the patient could be  discharged.   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   Alphonsa Overall, MD, [facility] Surgery Pager: [phone] Office phone:  [phone]

## 2017-07-20 NOTE — ED Notes (Signed)
Patient transported to CT 

## 2017-07-20 NOTE — Transfer of Care (Signed)
Immediate Anesthesia Transfer of Care Note  Patient: Caitlin Crane  Procedure(s) Performed: APPENDECTOMY LAPAROSCOPIC, RIGHT SALPINGECTOMY (Right Abdomen)  Patient Location: PACU  Anesthesia Type:General  Level of Consciousness: awake, alert  and oriented  Airway & Oxygen Therapy: Patient Spontanous Breathing and Patient connected to face mask oxygen  Post-op Assessment: Report given to RN and Post -op Vital signs reviewed and stable  Post vital signs: Reviewed and stable  Last Vitals:  Vitals:   07/20/17 1042  BP: (!) 146/83  Pulse: 86  Resp: (!) 22  Temp: 36.8 C  SpO2: 96%    Last Pain:  Vitals:   07/20/17 1412  TempSrc:   PainSc: 3          Complications: No apparent anesthesia complications

## 2017-07-20 NOTE — Anesthesia Postprocedure Evaluation (Signed)
Anesthesia Post Note  Patient: [place]  Procedure(s) Performed: APPENDECTOMY LAPAROSCOPIC, RIGHT SALPINGECTOMY (Right Abdomen)     Patient location during evaluation: PACU Anesthesia Type: General Level of consciousness: awake and alert Pain management: pain level controlled Vital Signs Assessment: post-procedure vital signs reviewed and stable Respiratory status: spontaneous breathing, nonlabored ventilation and respiratory function stable Cardiovascular status: blood pressure returned to baseline and stable Postop Assessment: no apparent nausea or vomiting Anesthetic complications: no    Last Vitals:  Vitals:   07/20/17 1630 07/20/17 1645  BP: (!) 159/88 (!) 158/81  Pulse: 93 95  Resp: 18 18  Temp:    SpO2: 100% 100%    Last Pain:  Vitals:   07/20/17 1645  TempSrc:   PainSc: Asleep                 Audry Pili

## 2017-07-20 NOTE — H&P (Addendum)
Caitlin Crane is an 41 y.o. female.   Chief Complaint: RLQ pain Pcp:  Roque Cash., MD  HPI: Pt presents to [place] with pain RLQ that started last week, worse on Friday.  NO nausea or vomiting, some decrease in appetite, no constipation.  Workup in the ED shows she is afebrile, BP up minimally, CMP and CBC are essentially normal.  CT scan shows Enlarged Right ovarian complex cyst, with some questionable inflammation secondary to distal appendicitis.  An appendicolith is seen within the appendix, with inflammatory changes along the distal aspect of the appendix.  Mass lesions within the liver with peripheral enhancements likely hemangiomas.    No past medical history on file.  Past Surgical History:  Procedure Laterality Date  . ABDOMINAL HYSTERECTOMY    . BREAST SURGERY      No family history on file. Social History:  reports that she has never smoked. She has never used smokeless tobacco. She reports that she does not drink alcohol or use drugs.  Allergies: No Known Allergies   General: Obese AA F who is alert. HEENT: Normal. Pupils equal. Good dentition. Neck: Supple. No mass.  No thyroid mass.   Lymph Nodes:  No supraclavicular or cervical nodes.  Lungs: Clear to auscultation and symmetric breath sounds. Heart:  RRR. No murmur or rub. Abdomen: Soft. No mass. Obese.  Quiet. Tender lower abdomen.   Rectal: Not done.  Extremities:  Good strength and ROM  in upper and lower extremities. Neurologic:  Grossly intact to motor and sensory function. Psychiatric: Has normal mood and affect. Behavior is normal.    Prior to Admission medications   Not on File     Results for orders placed or performed during the hospital encounter of 07/20/17 (from the past 48 hour(s))  CBC     Status: Abnormal   Collection Time: 07/20/17 11:09 AM  Result Value Ref Range   WBC 6.0 4.0 - 10.5 K/uL   RBC 4.35 3.87 - 5.11 MIL/uL   Hemoglobin 11.7 (L) 12.0 - 15.0 g/dL   HCT 35.5 (L) 36.0 -  46.0 %   MCV 81.6 78.0 - 100.0 fL   MCH 26.9 26.0 - 34.0 pg   MCHC 33.0 30.0 - 36.0 g/dL   RDW 12.9 11.5 - 15.5 %   Platelets 379 150 - 400 K/uL  Comprehensive metabolic panel     Status: None   Collection Time: 07/20/17 11:09 AM  Result Value Ref Range   Sodium 136 135 - 145 mmol/L   Potassium 3.6 3.5 - 5.1 mmol/L   Chloride 102 101 - 111 mmol/L   CO2 25 22 - 32 mmol/L   Glucose, Bld 92 65 - 99 mg/dL   BUN 9 6 - 20 mg/dL   Creatinine, Ser 0.69 0.44 - 1.00 mg/dL   Calcium 8.9 8.9 - 10.3 mg/dL   Total Protein 8.1 6.5 - 8.1 g/dL   Albumin 3.9 3.5 - 5.0 g/dL   AST 29 15 - 41 U/L   ALT 42 14 - 54 U/L   Alkaline Phosphatase 67 38 - 126 U/L   Total Bilirubin 0.4 0.3 - 1.2 mg/dL   GFR calc non Af Amer >60 >60 mL/min   GFR calc Af Amer >60 >60 mL/min    Comment: (NOTE) The eGFR has been calculated using the CKD EPI equation. This calculation has not been validated in all clinical situations. eGFR's persistently <60 mL/min signify possible Chronic Kidney Disease.    Anion gap 9  5 - 15  Lipase, blood     Status: None   Collection Time: 07/20/17 11:09 AM  Result Value Ref Range   Lipase 21 11 - 51 U/L   Ct Abdomen Pelvis W Contrast  Result Date: 07/20/2017 CLINICAL DATA:  Abdominal pain and tenderness, primarily right lower quadrant EXAM: CT ABDOMEN AND PELVIS WITH CONTRAST TECHNIQUE: Multidetector CT imaging of the abdomen and pelvis was performed using the standard protocol following bolus administration of intravenous contrast. CONTRAST:  138m ISOVUE-300 IOPAMIDOL (ISOVUE-300) INJECTION 61% COMPARISON:  None. FINDINGS: Lower chest: Lung bases are clear. Hepatobiliary: There is a mass occupying much of the left lobe of the liver with peripheral vascular enhancement, a likely hemangioma. This lesion measures 11.1 x 9.3 cm. A similar appearing lesion occupies much of the posterior segment of the right lobe of the liver measuring 8.1 x 6.7 cm. A third presumed hemangioma is seen in the  medial segment of the left lobe of the liver measuring 2.2 x 2.2 cm. Gallbladder wall is not appreciably thickened. There is no biliary duct dilatation. Pancreas: There is no pancreatic mass or inflammatory focus. Spleen: No splenic lesions are evident. Adrenals/Urinary Tract: Adrenals appear normal bilaterally. Kidneys bilaterally show no evident mass or hydronephrosis on either side. There is no renal or ureteral calculus on either side. Urinary bladder is midline with wall thickness within normal limits. Stomach/Bowel: There is moderate stool in the rectum without wall thickening in this area. There is no bowel wall or mesenteric thickening. No evident bowel obstruction. No free air or portal venous air. Vascular/Lymphatic: No abdominal aortic aneurysm. No vascular lesions are evident. No adenopathy is appreciable in the abdomen or pelvis by size criteria. There are subcentimeter inguinal lymph nodes bilaterally, viewed is nonspecific. Reproductive: Uterus is absent. There is a cyst arising in the periphery of the right ovary measuring 4.0 x 3.2 cm. There is a somewhat complex cystic area also in the right ovary measuring 2.1 by 2.7 cm. No other pelvic masses are identified. Other: There is an appendicolith in the appendix measuring 7 x 6 mm. There is no appreciable enlargement of the appendix. However, there is inflammation along the distal aspect of the appendix, immediately adjacent to the right ovary. No abscess or ascites evident in the abdomen or pelvis. Musculoskeletal: There are no blastic or lytic bone lesions. No intramuscular or abdominal wall lesion evident. IMPRESSION: 1. There is an appendicolith within the appendix. There is inflammatory change along the distal aspect of the appendix concerning for distal appendicitis. Note, however, that abnormal right ovary is immediately adjacent. 2. Enlarged right ovary with complex cystic areas within the right ovary. There may be inflammation involving the  right ovary due to what is felt to most likely represent distal appendicitis. Tubo-ovarian abscess on the right causing a adjacent appendiceal inflammation is a differential concern, however. 3. Mass lesions within the liver with peripheral enhancement, likely hemangiomas. 4. No well-defined abscess. No ascites. No adenopathy. Subcentimeter inguinal lymph nodes are a nonspecific finding. Critical Value/emergent results were called by telephone at the time of interpretation on 07/20/2017 at 12:00 pm to Dr. KJola Schmidt, who verbally acknowledged these results. Electronically Signed   By: WLowella GripIII M.D.   On: 07/20/2017 12:01    ROS  Blood pressure (!) 146/83, pulse 86, temperature 98.2 F (36.8 C), temperature source Oral, resp. rate (!) 22, SpO2 96 %. Physical Exam   Assessment/Plan Acute appendicitis with appendicolith. Large right complex ovarian cyst with  possible inflammation Large liver hemangiomas  She was unaware of these.  Will give her a copy of her CT scan report.   Plan:  Transfer to [facility] holding area.    JENNINGS,WILLARD, PA-C 07/20/2017, 12:21 PM  Agree with above. Story a little odd, in that she has had symptoms for 6 days. Had a partial hysterectomy by Dr. Teryl Lucy, [facility], in 2016. Has a right ovarian cyst that Dr. Dema Severin is following. She has no PCP. Otherwise healthy.  I discussed with the patient the indications and risks of appendiceal surgery.  The primary risks of appendiceal surgery include, but are not limited to, bleeding, infection, bowel surgery, and open surgery.  There is also the risk that the patient may have continued symptoms after surgery.    I don't know how much the right ovary is causing trouble.  We discussed the typical post-operative recovery course. I tried to answer the patient's questions.  Unmarried. Lives by self.  Her cousin, Caitlin Crane, is her medical contact. She works for [employer], [employer].  Alphonsa Overall, MD, [facility] Surgery Pager: [phone] Office phone:  [phone]

## 2017-07-20 NOTE — ED Triage Notes (Signed)
RLQ abdominal pain started last week, worsened on Friday.  No N/V.  Some decreased appetite.  No constipation.

## 2017-07-20 NOTE — Anesthesia Procedure Notes (Signed)
Procedure Name: Intubation Date/Time: 07/20/2017 2:41 PM Performed by: Noralyn Pick D Pre-anesthesia Checklist: Patient identified, Emergency Drugs available, Suction available and Patient being monitored Patient Re-evaluated:Patient Re-evaluated prior to induction Oxygen Delivery Method: Circle system utilized Preoxygenation: Pre-oxygenation with 100% oxygen Induction Type: IV induction Ventilation: Mask ventilation without difficulty Tube type: Oral Tube size: 7.5 mm Number of attempts: 1 Airway Equipment and Method: Stylet and Oral airway Placement Confirmation: ETT inserted through vocal cords under direct vision,  positive ETCO2 and breath sounds checked- equal and bilateral Secured at: 22 cm Tube secured with: Tape Dental Injury: Teeth and Oropharynx as per pre-operative assessment

## 2017-07-20 NOTE — ED Notes (Signed)
Report to Seth Bake in [place]

## 2017-07-20 NOTE — ED Provider Notes (Signed)
[facility] DEPT [facility] Provider Note   CSN: [id number] Arrival date & time: 07/20/17  1033     History   Chief Complaint Chief Complaint  Patient presents with  . Abdominal Pain    HPI Caitlin Crane is a 41 y.o. female.  HPI Patient presents with approximate 5 days of right lower quadrant pain now worsening over the past 3 days.  Denies nausea vomiting.  Reports chills with without documented fever. No urinary symptoms.  No diarrhea. She reports a history of abnormality around her right ovary which is being followed closely by gynecology.  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  Her pain is significantly worse over the past 24 hours and it hurts to move, laugh, drive over here.  Pain is moderate to severe with palpation of Her right lower quadrant   No past medical history on file.  There are no active problems to display for this patient.   Past Surgical History:  Procedure Laterality Date  . ABDOMINAL HYSTERECTOMY    . BREAST SURGERY      OB History    No data available       Home Medications    Prior to Admission medications   Not on File    Family History No family history on file.  Social History Social History  Substance Use Topics  . Smoking status: Never Smoker  . Smokeless tobacco: Never Used  . Alcohol use No     Allergies   Patient has no known allergies.   Review of Systems Review of Systems  All other systems reviewed and are negative.    Physical Exam Updated Vital Signs BP (!) 146/83 (BP Location: Right Arm)   Pulse 86   Temp 98.2 F (36.8 C) (Oral)   Resp (!) 22   SpO2 96%   Physical Exam  Constitutional: She is oriented to person, place, and time. She appears well-developed and well-nourished. No distress.  HENT:  Head: Normocephalic and atraumatic.  Eyes: EOM are normal.  Neck: Normal range of motion.  Cardiovascular: Normal  rate, regular rhythm and normal heart sounds.   Pulmonary/Chest: Effort normal and breath sounds normal.  Abdominal: Soft. She exhibits no distension.  Right lower qandrant tenderness  Musculoskeletal: Normal range of motion.  Neurological: She is alert and oriented to person, place, and time.  Skin: Skin is warm and dry.  Psychiatric: She has a normal mood and affect. Judgment normal.  Nursing note and vitals reviewed.    ED Treatments / Results  Labs (all labs ordered are listed, but only abnormal results are displayed) Labs Reviewed  CBC - Abnormal; Notable for the following:       Result Value   Hemoglobin 11.7 (*)    HCT 35.5 (*)    All other components within normal limits  COMPREHENSIVE METABOLIC PANEL  LIPASE, BLOOD  URINALYSIS, ROUTINE W REFLEX MICROSCOPIC    EKG  EKG Interpretation None       Radiology Ct Abdomen Pelvis W Contrast  Result Date: 07/20/2017 CLINICAL DATA:  Abdominal pain and tenderness, primarily right lower quadrant EXAM: CT ABDOMEN AND PELVIS WITH CONTRAST TECHNIQUE: Multidetector CT imaging of the abdomen and pelvis was performed using the standard protocol following bolus administration of intravenous contrast. CONTRAST:  145mL ISOVUE-300 IOPAMIDOL (ISOVUE-300) INJECTION 61% COMPARISON:  None. FINDINGS: Lower chest: Lung bases are clear. Hepatobiliary: There is a mass occupying much of the left lobe  of the liver with peripheral vascular enhancement, a likely hemangioma. This lesion measures 11.1 x 9.3 cm. A similar appearing lesion occupies much of the posterior segment of the right lobe of the liver measuring 8.1 x 6.7 cm. A third presumed hemangioma is seen in the medial segment of the left lobe of the liver measuring 2.2 x 2.2 cm. Gallbladder wall is not appreciably thickened. There is no biliary duct dilatation. Pancreas: There is no pancreatic mass or inflammatory focus. Spleen: No splenic lesions are evident. Adrenals/Urinary Tract: Adrenals  appear normal bilaterally. Kidneys bilaterally show no evident mass or hydronephrosis on either side. There is no renal or ureteral calculus on either side. Urinary bladder is midline with wall thickness within normal limits. Stomach/Bowel: There is moderate stool in the rectum without wall thickening in this area. There is no bowel wall or mesenteric thickening. No evident bowel obstruction. No free air or portal venous air. Vascular/Lymphatic: No abdominal aortic aneurysm. No vascular lesions are evident. No adenopathy is appreciable in the abdomen or pelvis by size criteria. There are subcentimeter inguinal lymph nodes bilaterally, viewed is nonspecific. Reproductive: Uterus is absent. There is a cyst arising in the periphery of the right ovary measuring 4.0 x 3.2 cm. There is a somewhat complex cystic area also in the right ovary measuring 2.1 by 2.7 cm. No other pelvic masses are identified. Other: There is an appendicolith in the appendix measuring 7 x 6 mm. There is no appreciable enlargement of the appendix. However, there is inflammation along the distal aspect of the appendix, immediately adjacent to the right ovary. No abscess or ascites evident in the abdomen or pelvis. Musculoskeletal: There are no blastic or lytic bone lesions. No intramuscular or abdominal wall lesion evident. IMPRESSION: 1. There is an appendicolith within the appendix. There is inflammatory change along the distal aspect of the appendix concerning for distal appendicitis. Note, however, that abnormal right ovary is immediately adjacent. 2. Enlarged right ovary with complex cystic areas within the right ovary. There may be inflammation involving the right ovary due to what is felt to most likely represent distal appendicitis. Tubo-ovarian abscess on the right causing a adjacent appendiceal inflammation is a differential concern, however. 3. Mass lesions within the liver with peripheral enhancement, likely hemangiomas. 4. No  well-defined abscess. No ascites. No adenopathy. Subcentimeter inguinal lymph nodes are a nonspecific finding. Critical Value/emergent results were called by telephone at the time of interpretation on 07/20/2017 at 12:00 pm to Dr. Jola Schmidt , who verbally acknowledged these results. Electronically Signed   By: Lowella Grip III M.D.   On: 07/20/2017 12:01    Procedures Procedures (including critical care time)  Medications Ordered in ED Medications  cefTRIAXone (ROCEPHIN) 2 g in dextrose 5 % 50 mL IVPB (not administered)    And  metroNIDAZOLE (FLAGYL) IVPB 500 mg (not administered)  ondansetron (ZOFRAN-ODT) disintegrating tablet 8 mg (8 mg Oral Given 07/20/17 1131)  sodium chloride 0.9 % bolus 1,000 mL (1,000 mLs Intravenous New Bag/Given 07/20/17 1127)  HYDROmorphone (DILAUDID) injection 1 mg (1 mg Intravenous Given 07/20/17 1131)  iopamidol (ISOVUE-300) 61 % injection 100 mL (100 mLs Intravenous Contrast Given 07/20/17 1136)     Initial Impression / Assessment and Plan / ED Course  I have reviewed the triage vital signs and the nursing notes.  Pertinent labs & imaging results that were available during my care of the patient were reviewed by me and considered in my medical decision making (see chart for details).  Symptoms concerning for acute appendicitis.  Patient be transferred to [facility] long for evaluation by general surgery.  Case discussed with Dr. Lucia Gaskins of general surgery.  Antibiotics now.  Patient updated.  Nothing by mouth status  Final Clinical Impressions(s) / ED Diagnoses   Final diagnoses:  Acute appendicitis, unspecified acute appendicitis type    New Prescriptions New Prescriptions   No medications on file     Jola Schmidt, MD 07/20/17 1254

## 2017-07-20 NOTE — Anesthesia Preprocedure Evaluation (Addendum)
Anesthesia Evaluation  Patient identified by MRN, date of birth, ID band Patient awake    Reviewed: Allergy & Precautions, NPO status , Patient's Chart, lab work & pertinent test results  Airway Mallampati: II  TM Distance: >3 FB Neck ROM: Full    Dental no notable dental hx. (+) Teeth Intact, Dental Advisory Given   Pulmonary neg pulmonary ROS,    Pulmonary exam normal breath sounds clear to auscultation       Cardiovascular negative cardio ROS Normal cardiovascular exam Rhythm:Regular Rate:Normal     Neuro/Psych negative neurological ROS  negative psych ROS   GI/Hepatic negative GI ROS, Neg liver ROS,   Endo/Other  negative endocrine ROS  Renal/GU negative Renal ROS  negative genitourinary   Musculoskeletal negative musculoskeletal ROS (+)   Abdominal   Peds negative pediatric ROS (+)  Hematology negative hematology ROS (+)   Anesthesia Other Findings   Reproductive/Obstetrics negative OB ROS                            Anesthesia Physical Anesthesia Plan  ASA: I  Anesthesia Plan: General   Post-op Pain Management:    Induction: Intravenous, Cricoid pressure planned and Rapid sequence  PONV Risk Score and Plan: 2 and Ondansetron and Dexamethasone  Airway Management Planned: Oral ETT  Additional Equipment:   Intra-op Plan:   Post-operative Plan: Extubation in OR  Informed Consent: I have reviewed the patients History and Physical, chart, labs and discussed the procedure including the risks, benefits and alternatives for the proposed anesthesia with the patient or authorized representative who has indicated his/her understanding and acceptance.   Dental advisory given  Plan Discussed with: CRNA  Anesthesia Plan Comments:        Anesthesia Quick Evaluation

## 2017-07-21 MED ORDER — HYDROCODONE-ACETAMINOPHEN 5-325 MG PO TABS: 1.0000 | ORAL_TABLET | ORAL | 0 refills | Status: DC | PRN

## 2017-07-21 NOTE — Discharge Summary (Signed)
Physician Discharge Summary  Patient ID:  Caitlin Crane  MRN: [id number]  DOB/AGE: 1976-01-31 41 y.o.  Admit date: 07/20/2017 Discharge date: 07/21/2017  Discharge Diagnoses:  1.  Torsion of the right fallopian tube 2   Appendicitis of the tip of the appendix - probably secondary to being stuck to the right fallopian tube. 3.  Hemangiomas of both lobes of the liver   Active Problems:   S/P appendectomy   Torsion of right fallopian tube  Operation: Procedure(s): APPENDECTOMY LAPAROSCOPIC, RIGHT SALPINGECTOMY on 07/20/2017 - D. Lucia Gaskins  Discharged Condition: good  Hospital Course: Caitlin Crane is an 41 y.o. female whose primary care physician is Roque Cash., MD and who was admitted 07/20/2017 with a chief complaint of  Chief Complaint  Patient presents with  . Abdominal Pain  .   She was brought to the operating room on 07/20/2017 and underwent  APPENDECTOMY LAPAROSCOPIC, RIGHT SALPINGECTOMY.   She is now one day post op, taking po's, and pain controlled. I gave her a copy of photos of her surgery and a copy of her CT scan report. She is ready to go home.  The discharge instructions were reviewed with the patient.  Consults: None  Significant Diagnostic Studies: Results for orders placed or performed during the hospital encounter of 07/20/17  CBC  Result Value Ref Range   WBC 6.0 4.0 - 10.5 K/uL   RBC 4.35 3.87 - 5.11 MIL/uL   Hemoglobin 11.7 (L) 12.0 - 15.0 g/dL   HCT 35.5 (L) 36.0 - 46.0 %   MCV 81.6 78.0 - 100.0 fL   MCH 26.9 26.0 - 34.0 pg   MCHC 33.0 30.0 - 36.0 g/dL   RDW 12.9 11.5 - 15.5 %   Platelets 379 150 - 400 K/uL  Comprehensive metabolic panel  Result Value Ref Range   Sodium 136 135 - 145 mmol/L   Potassium 3.6 3.5 - 5.1 mmol/L   Chloride 102 101 - 111 mmol/L   CO2 25 22 - 32 mmol/L   Glucose, Bld 92 65 - 99 mg/dL   BUN 9 6 - 20 mg/dL   Creatinine, Ser 0.69 0.44 - 1.00 mg/dL   Calcium 8.9 8.9 - 10.3 mg/dL   Total Protein 8.1 6.5  - 8.1 g/dL   Albumin 3.9 3.5 - 5.0 g/dL   AST 29 15 - 41 U/L   ALT 42 14 - 54 U/L   Alkaline Phosphatase 67 38 - 126 U/L   Total Bilirubin 0.4 0.3 - 1.2 mg/dL   GFR calc non Af Amer >60 >60 mL/min   GFR calc Af Amer >60 >60 mL/min   Anion gap 9 5 - 15  Lipase, blood  Result Value Ref Range   Lipase 21 11 - 51 U/L  Urinalysis, Routine w reflex microscopic  Result Value Ref Range   Color, Urine YELLOW YELLOW   APPearance HAZY (A) CLEAR   Specific Gravity, Urine <1.005 (L) 1.005 - 1.030   pH 6.5 5.0 - 8.0   Glucose, UA NEGATIVE NEGATIVE mg/dL   Hgb urine dipstick MODERATE (A) NEGATIVE   Bilirubin Urine NEGATIVE NEGATIVE   Ketones, ur NEGATIVE NEGATIVE mg/dL   Protein, ur NEGATIVE NEGATIVE mg/dL   Nitrite NEGATIVE NEGATIVE   Leukocytes, UA NEGATIVE NEGATIVE  Urinalysis, Microscopic (reflex)  Result Value Ref Range   RBC / HPF 6-30 0 - 5 RBC/hpf   WBC, UA 6-30 0 - 5 WBC/hpf   Bacteria, UA MANY (A) NONE SEEN  Squamous Epithelial / LPF 6-30 (A) NONE SEEN    Ct Abdomen Pelvis W Contrast  Result Date: 07/20/2017 CLINICAL DATA:  Abdominal pain and tenderness, primarily right lower quadrant EXAM: CT ABDOMEN AND PELVIS WITH CONTRAST TECHNIQUE: Multidetector CT imaging of the abdomen and pelvis was performed using the standard protocol following bolus administration of intravenous contrast. CONTRAST:  141mL ISOVUE-300 IOPAMIDOL (ISOVUE-300) INJECTION 61% COMPARISON:  None. FINDINGS: Lower chest: Lung bases are clear. Hepatobiliary: There is a mass occupying much of the left lobe of the liver with peripheral vascular enhancement, a likely hemangioma. This lesion measures 11.1 x 9.3 cm. A similar appearing lesion occupies much of the posterior segment of the right lobe of the liver measuring 8.1 x 6.7 cm. A third presumed hemangioma is seen in the medial segment of the left lobe of the liver measuring 2.2 x 2.2 cm. Gallbladder wall is not appreciably thickened. There is no biliary duct  dilatation. Pancreas: There is no pancreatic mass or inflammatory focus. Spleen: No splenic lesions are evident. Adrenals/Urinary Tract: Adrenals appear normal bilaterally. Kidneys bilaterally show no evident mass or hydronephrosis on either side. There is no renal or ureteral calculus on either side. Urinary bladder is midline with wall thickness within normal limits. Stomach/Bowel: There is moderate stool in the rectum without wall thickening in this area. There is no bowel wall or mesenteric thickening. No evident bowel obstruction. No free air or portal venous air. Vascular/Lymphatic: No abdominal aortic aneurysm. No vascular lesions are evident. No adenopathy is appreciable in the abdomen or pelvis by size criteria. There are subcentimeter inguinal lymph nodes bilaterally, viewed is nonspecific. Reproductive: Uterus is absent. There is a cyst arising in the periphery of the right ovary measuring 4.0 x 3.2 cm. There is a somewhat complex cystic area also in the right ovary measuring 2.1 by 2.7 cm. No other pelvic masses are identified. Other: There is an appendicolith in the appendix measuring 7 x 6 mm. There is no appreciable enlargement of the appendix. However, there is inflammation along the distal aspect of the appendix, immediately adjacent to the right ovary. No abscess or ascites evident in the abdomen or pelvis. Musculoskeletal: There are no blastic or lytic bone lesions. No intramuscular or abdominal wall lesion evident. IMPRESSION: 1. There is an appendicolith within the appendix. There is inflammatory change along the distal aspect of the appendix concerning for distal appendicitis. Note, however, that abnormal right ovary is immediately adjacent. 2. Enlarged right ovary with complex cystic areas within the right ovary. There may be inflammation involving the right ovary due to what is felt to most likely represent distal appendicitis. Tubo-ovarian abscess on the right causing a adjacent appendiceal  inflammation is a differential concern, however. 3. Mass lesions within the liver with peripheral enhancement, likely hemangiomas. 4. No well-defined abscess. No ascites. No adenopathy. Subcentimeter inguinal lymph nodes are a nonspecific finding. Critical Value/emergent results were called by telephone at the time of interpretation on 07/20/2017 at 12:00 pm to Dr. Jola Schmidt , who verbally acknowledged these results. Electronically Signed   By: Lowella Grip III M.D.   On: 07/20/2017 12:01    Discharge Exam:  Vitals:   07/21/17 0208 07/21/17 0543  BP: 137/85 (!) 149/91  Pulse: 79 84  Resp: 20 20  Temp: 98.7 F (37.1 C) 98.7 F (37.1 C)  SpO2: 98% 99%    General: Obese AA F who is alert and generally healthy appearing.  Lungs: Clear to auscultation and symmetric breath sounds. Heart:  RRR. No murmur or rub. Abdomen: Soft. No mass. Normal bowel sounds. Wounds looks good.  Discharge Medications:   Allergies as of 07/21/2017   No Known Allergies     Medication List    TAKE these medications   HYDROcodone-acetaminophen 5-325 MG tablet Commonly known as:  NORCO/VICODIN Take 1-2 tablets by mouth every 4 (four) hours as needed for moderate pain.   naproxen sodium 220 MG tablet Commonly known as:  ANAPROX Take 220 mg by mouth daily as needed (pain).       Disposition: 01-Home or Self Care  Discharge Instructions    Diet - low sodium heart healthy    Complete by:  As directed    Increase activity slowly    Complete by:  As directed       Follow-up Information    Roque Cash., MD.   Specialty:  Obstetrics and Gynecology Contact information: [address] [place] [address] [phone]            Signed: Alphonsa Overall, M.D., [facility] Office:  [phone]  07/21/2017, 8:12 AM

## 2017-07-21 NOTE — Progress Notes (Signed)
Assessment unchanged. Pt verbalized understanding of dc instructions through teach back including when to call MD, follow up care as well as meds and activity to resume. Scripts given to pt by Dr. Lucia Gaskins. Pt discharged via wc to front entrance accompanied by [employer] nursing student and cousin.

## 2017-07-21 NOTE — Discharge Instructions (Signed)
[facility] [place] [facility] - DISCHARGE INSTRUCTIONS TO PATIENT  Return to work on:  May return to work in about one week  Activity:  Driving - May drive in 2 or 3 days, if doing well   Lifting - No lifting more than 15 pounds for 5 days, then no limit  Wound Care:   May shower starting tomorrow  Diet:  As tolerated  Follow up appointment:  Call Dr. Pollie Friar office [facility]) at [phone] for an appointment in 2 to 3 weeks.  Medications and dosages:  Resume your home medications.  You have a prescription for:  vicodiin  Call Dr. Lucia Gaskins or his office  [phone]) if you have:  Temperature greater than 100.4,  Persistent nausea and vomiting,  Severe uncontrolled pain,  Redness, tenderness, or signs of infection (pain, swelling, redness, odor or green/yellow discharge around the site),  Difficulty breathing, headache or visual disturbances,  Any other questions or concerns you may have after discharge.  In an emergency, call 911 or go to an Emergency Department at a nearby hospital.

## 2018-06-18 IMAGING — CR DG CHEST 2V
2 series · 2 of 2 positions shown · non-contrast
Comparison: None.

CLINICAL DATA: Chest pain

EXAM:
CHEST  2 VIEW

[w chest pa]
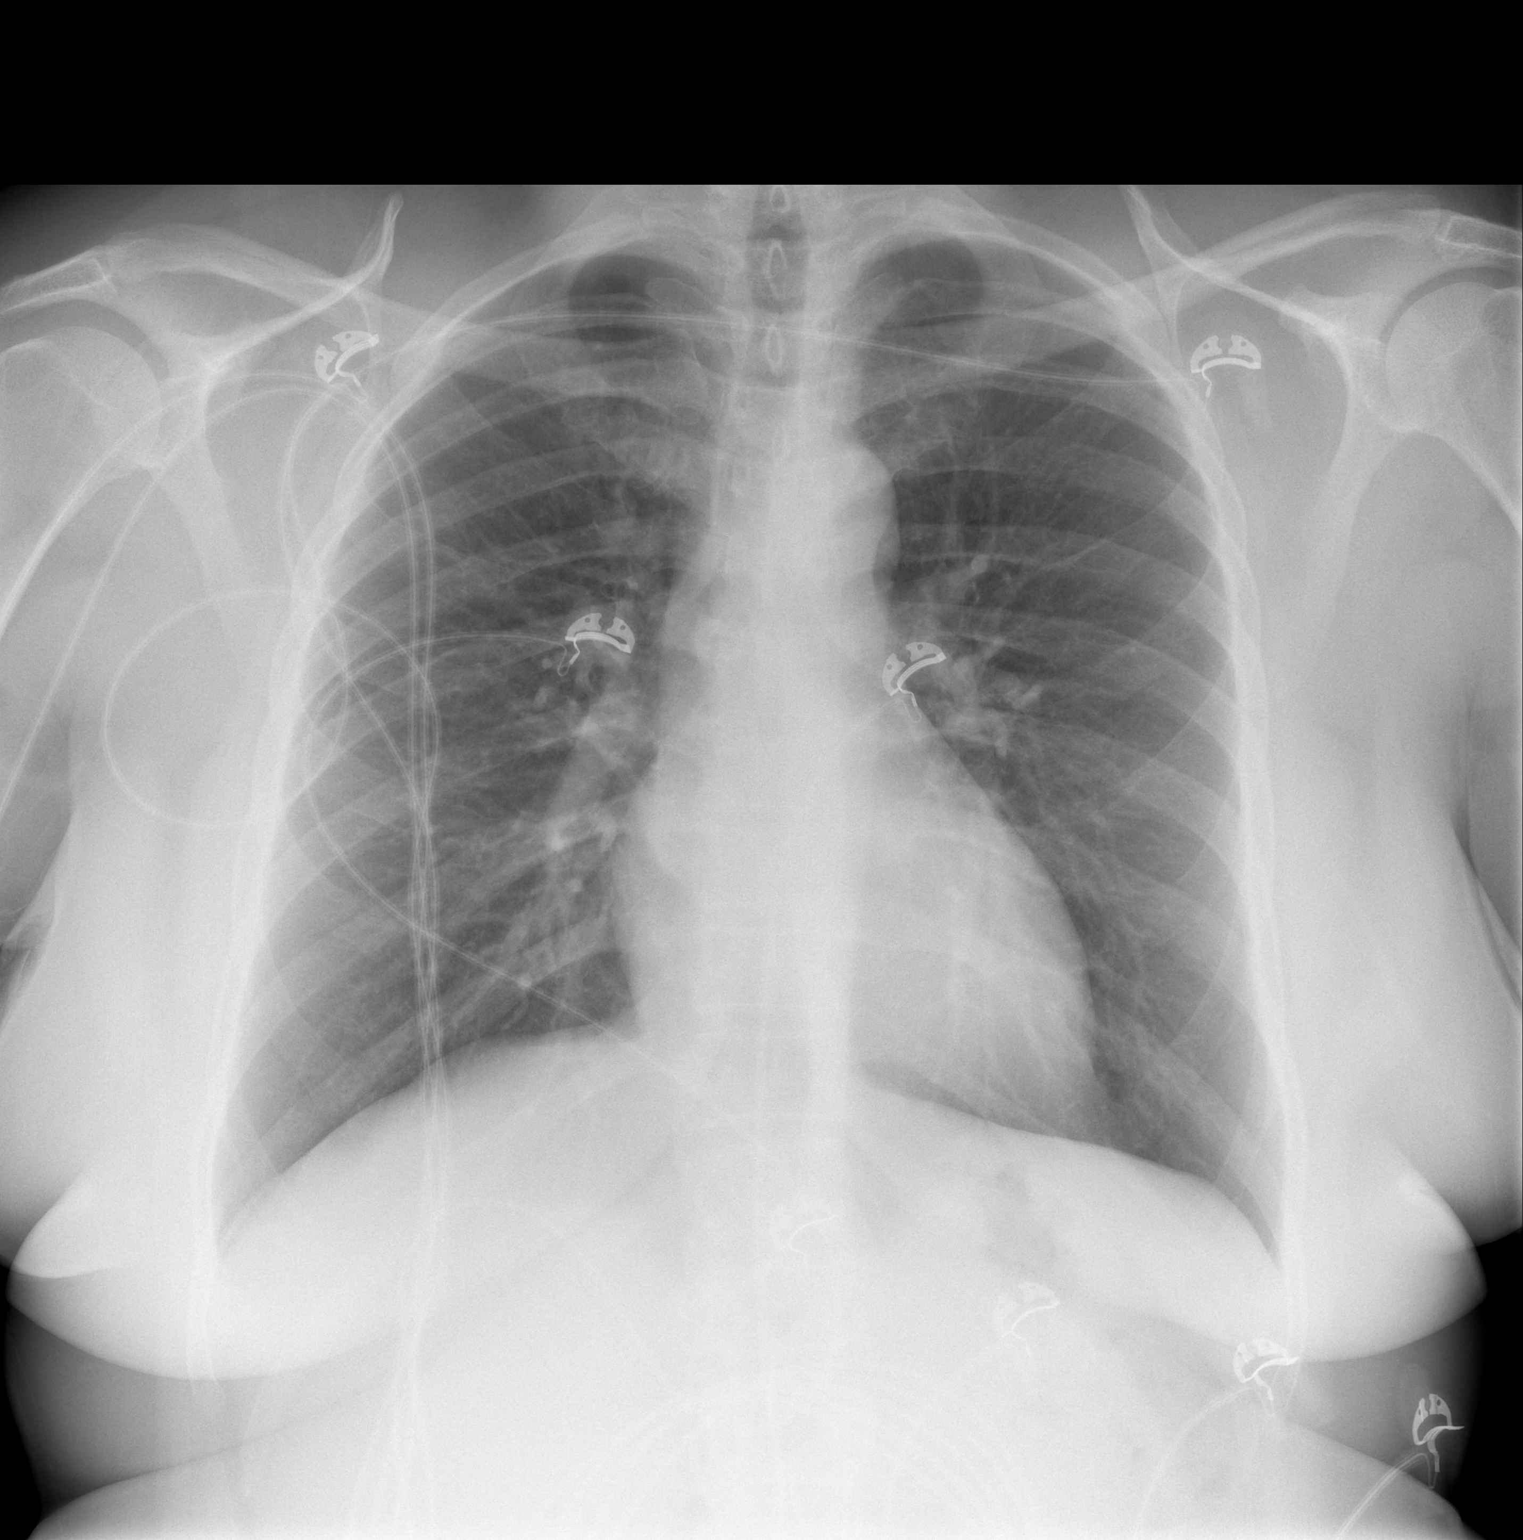

[w chest lat]
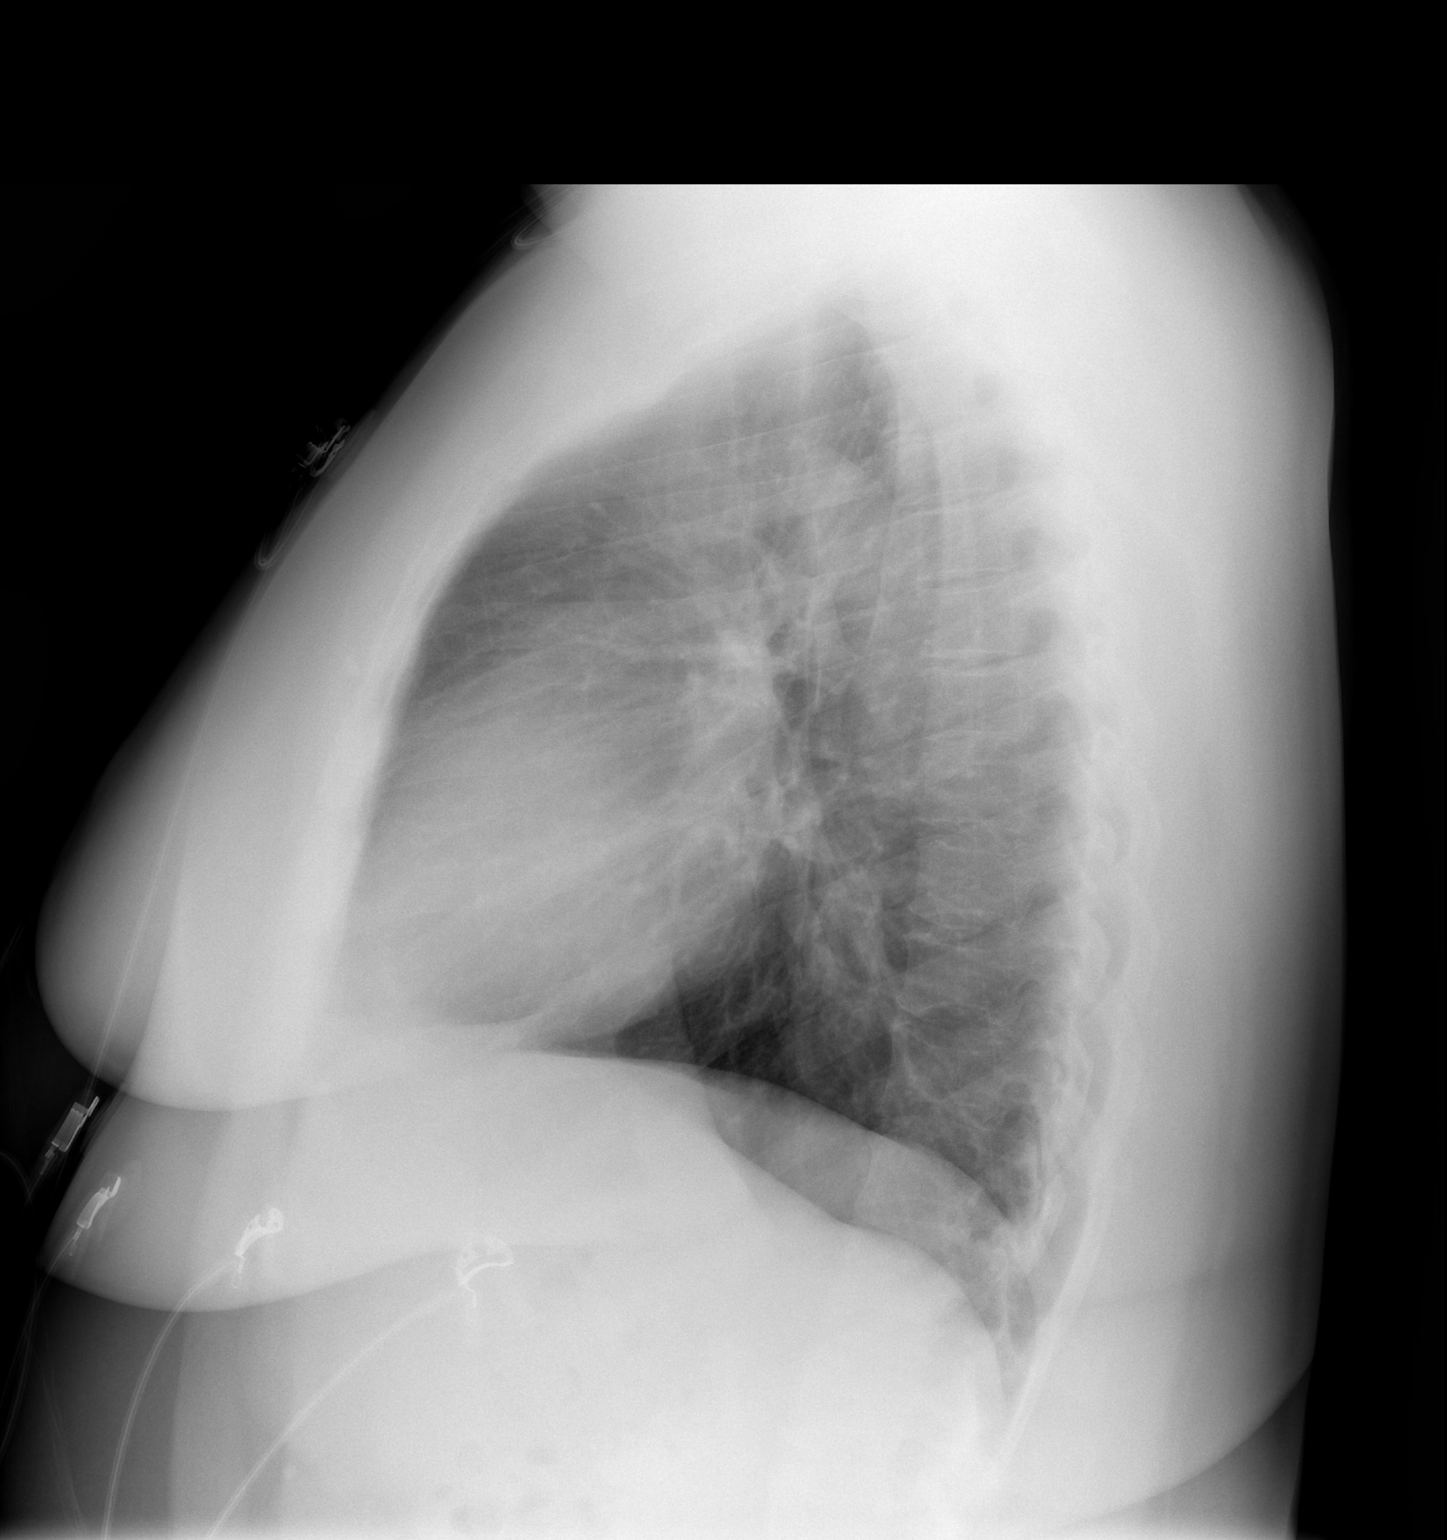

[2 of 2 positions shown; findings below may reference images not displayed]

FINDINGS: Heart and mediastinal contours are within normal limits. No focal
opacities or effusions. No acute bony abnormality.
IMPRESSION: No active cardiopulmonary disease.

## 2018-06-18 IMAGING — CT CT ANGIO CHEST
2 of 16 series · 11 of 37 positions shown · IV contrast (isovue)
Comparison: None.

CLINICAL DATA: Right upper abdominal pain for 1-2 weeks. Chest
pain.

EXAM:
CT ANGIOGRAPHY CHEST
CT ABDOMEN AND PELVIS WITH CONTRAST
TECHNIQUE: Multidetector CT imaging of the chest was performed using the
standard protocol during bolus administration of intravenous
contrast. Multiplanar CT image reconstructions and MIPs were
obtained to evaluate the vascular anatomy. Multidetector CT imaging
of the abdomen and pelvis was performed using the standard protocol
during bolus administration of intravenous contrast.
CONTRAST:  100 mL Isovue 370

[Series 8: axial st · axial · 0.98mm/px · z∈[-522,-302]mm · 3 of 90 slices shown]
[im 23/90  lung]
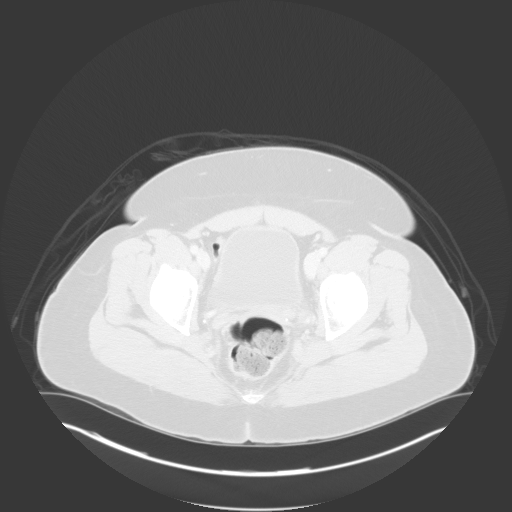
[im 45/90  mediastinal]
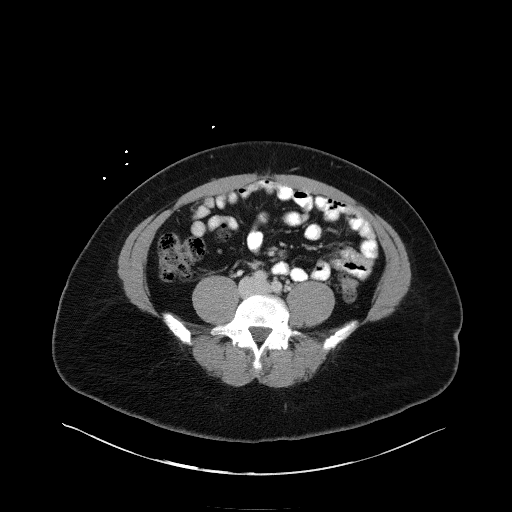
[im 67/90  lung]
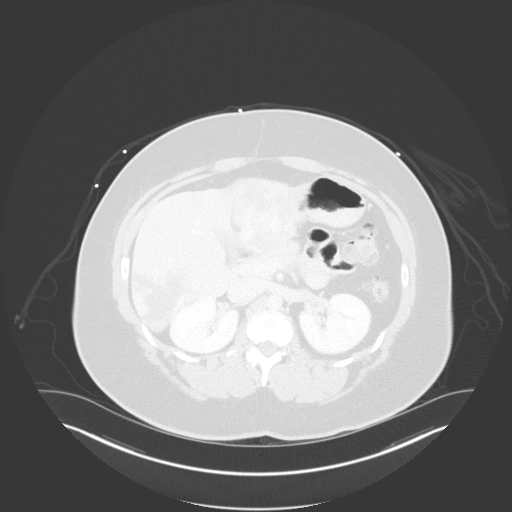

[Series 15: pe thins · axial · 0.67mm/px · z∈[-227,-37]mm · 8 of 230 slices shown]
[im 20/230  lung]
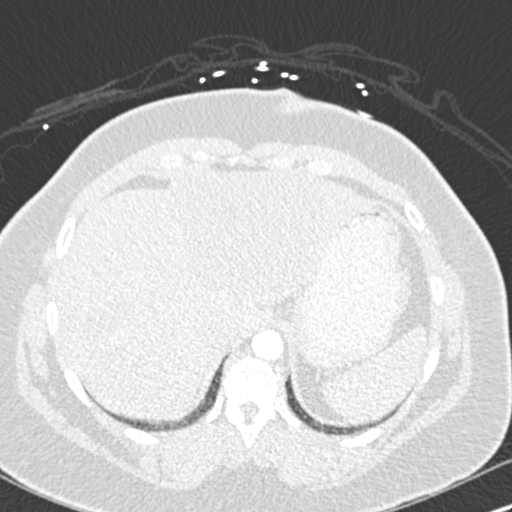
[im 58/230  lung]
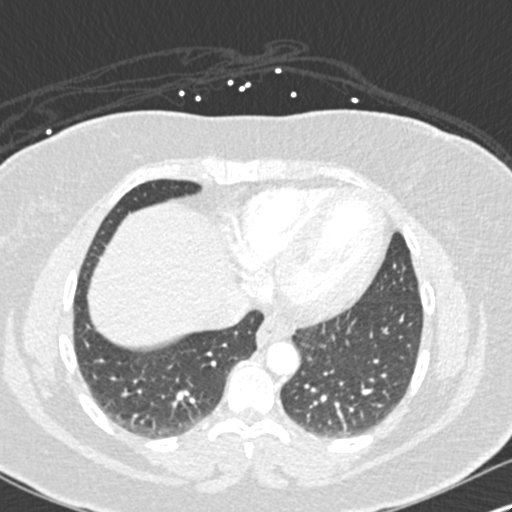
[im 77/230  lung]
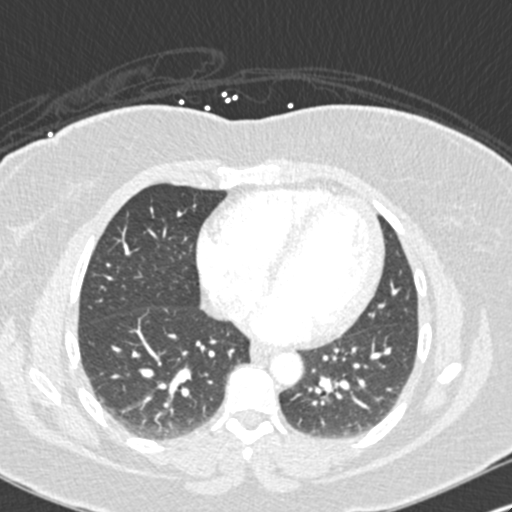
[im 96/230  lung]
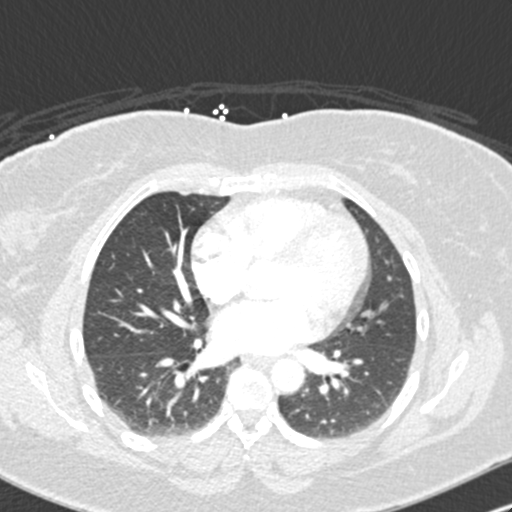
[im 134/230  lung]
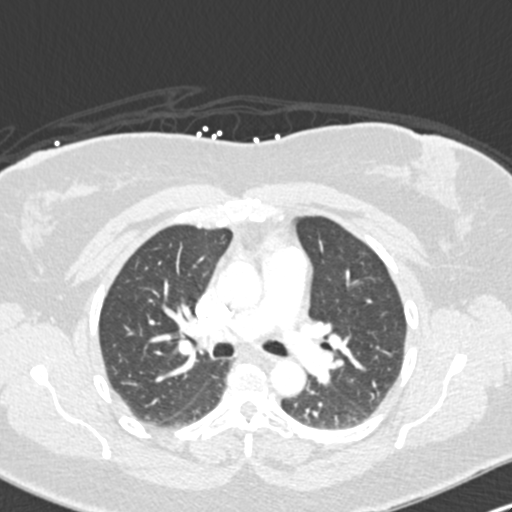
[im 153/230  lung]
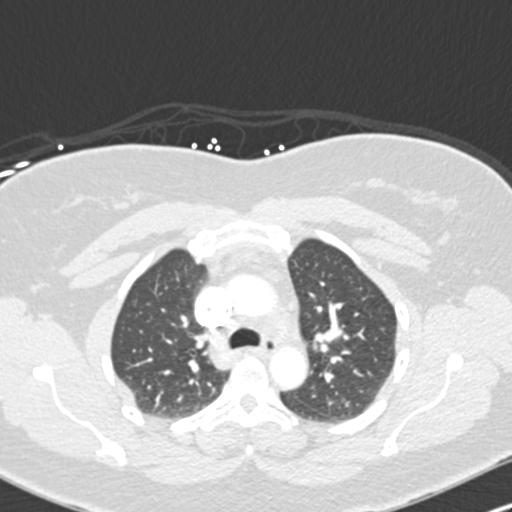
[im 172/230  lung]
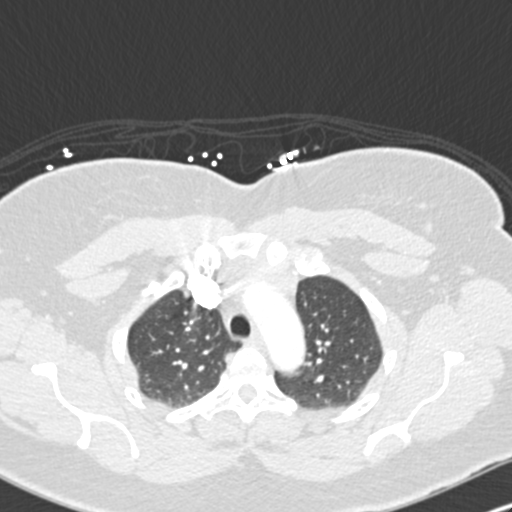
[im 210/230  lung]
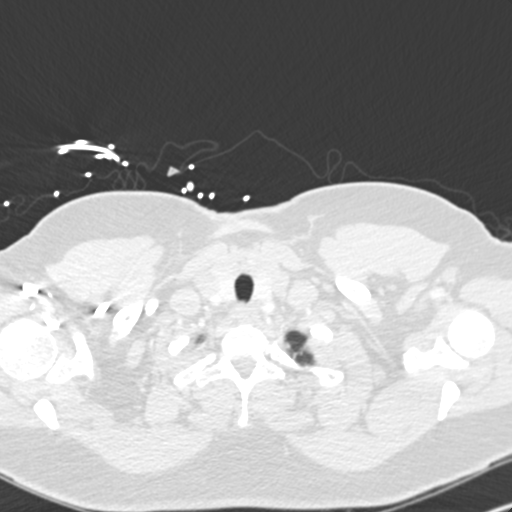

[11 of 37 positions shown; findings below may reference images not displayed]

FINDINGS: CTA CHEST FINDINGS

Cardiovascular: Satisfactory opacification of the pulmonary arteries
to the segmental level. No evidence of pulmonary embolism. Normal
heart size. No pericardial effusion. Normal caliber thoracic aorta.
No thoracic aortic aneurysm. No thoracic aortic dissection.

Mediastinum/Nodes: No enlarged mediastinal, hilar, or axillary lymph
nodes. Thyroid gland, trachea, and esophagus demonstrate no
significant findings.

Lungs/Pleura: Lungs are clear. No pleural effusion or pneumothorax.

Musculoskeletal: No chest wall abnormality. No acute or significant
osseous findings.

Review of the MIP images confirms the above findings.

CT ABDOMEN and PELVIS FINDINGS

Hepatobiliary: 10.6 x 9.8 cm left hepatic mass with peripheral
nodular enhancement encompassing nearly the entirety of the left
hepatic lobe most consistent with a giant hemangioma. 6.2 x 5.8 cm
right hepatic mass with peripheral nodular enhancement most
consistent with a hemangioma. Normal gallbladder. No intrahepatic or
extrahepatic biliary ductal dilatation.

Pancreas: Unremarkable. No pancreatic ductal dilatation or
surrounding inflammatory changes.

Spleen: Normal in size without focal abnormality.

Adrenals/Urinary Tract: Adrenal glands are unremarkable. Kidneys are
normal, without renal calculi, focal lesion, or hydronephrosis.
Bladder is unremarkable.

Stomach/Bowel: Small hiatal hernia. Stomach is otherwise within
normal limits. Appendix appears normal. No evidence of bowel wall
thickening, distention, or inflammatory changes. Diverticulosis
without evidence diverticulitis.

Vascular/Lymphatic: Normal caliber abdominal aorta. No
lymphadenopathy.

Reproductive: Status post hysterectomy. No adnexal masses.

Other: No abdominal wall hernia or abnormality. No abdominopelvic
ascites.

Musculoskeletal: No acute osseous abnormality. No lytic or sclerotic
osseous lesion.

Review of the MIP images confirms the above findings.
IMPRESSION: 1. No pulmonary embolus.
2. No acute cardiopulmonary disease.
3. No acute abdominal or pelvic pathology.
4. Giant left hepatic lobe hemangioma encompassing nearly the
entirety of the left hepatic lobe.
5. 6.2 x 5.8 cm hemangioma in the right hepatic lobe.

## 2018-06-29 DIAGNOSIS — Z1231 Encounter for screening mammogram for malignant neoplasm of breast: Secondary | ICD-10-CM | POA: Diagnosis not present

## 2018-07-15 DIAGNOSIS — N9489 Other specified conditions associated with female genital organs and menstrual cycle: Secondary | ICD-10-CM | POA: Diagnosis not present

## 2018-10-05 DIAGNOSIS — M545 Low back pain: Secondary | ICD-10-CM | POA: Diagnosis not present

## 2018-10-13 DIAGNOSIS — M5416 Radiculopathy, lumbar region: Secondary | ICD-10-CM | POA: Diagnosis not present

## 2020-02-24 ENCOUNTER — Emergency Department (HOSPITAL_BASED_OUTPATIENT_CLINIC_OR_DEPARTMENT_OTHER): Payer: 59

## 2020-02-24 ENCOUNTER — Other Ambulatory Visit: Payer: Self-pay

## 2020-02-24 ENCOUNTER — Encounter (HOSPITAL_BASED_OUTPATIENT_CLINIC_OR_DEPARTMENT_OTHER): Payer: Self-pay

## 2020-02-24 ENCOUNTER — Emergency Department (HOSPITAL_BASED_OUTPATIENT_CLINIC_OR_DEPARTMENT_OTHER)
Admission: EM | Admit: 2020-02-24 | Discharge: 2020-02-25 | Disposition: A | Discharge status: Home / Self Care | Payer: 59 | Attending: Emergency Medicine | Admitting: Emergency Medicine

## 2020-02-24 DIAGNOSIS — R1011 Right upper quadrant pain: Secondary | ICD-10-CM | POA: Insufficient documentation

## 2020-02-24 DIAGNOSIS — R1013 Epigastric pain: Secondary | ICD-10-CM | POA: Diagnosis present

## 2020-02-24 DIAGNOSIS — D1803 Hemangioma of intra-abdominal structures: Secondary | ICD-10-CM | POA: Diagnosis not present

## 2020-02-24 DIAGNOSIS — Z79899 Other long term (current) drug therapy: Secondary | ICD-10-CM | POA: Insufficient documentation

## 2020-02-24 HISTORY — DX: Hemangioma of intra-abdominal structures: D18.03

## 2020-02-24 LAB — CBC
HCT: 39.6 % (ref 36.0–46.0)
Hemoglobin: 12.6 g/dL (ref 12.0–15.0)
MCH: 27 pg (ref 26.0–34.0)
MCHC: 31.8 g/dL (ref 30.0–36.0)
MCV: 84.8 fL (ref 80.0–100.0)
Platelets: 330 10*3/uL (ref 150–400)
RBC: 4.67 MIL/uL (ref 3.87–5.11)
RDW: 13.3 % (ref 11.5–15.5)
WBC: 5.7 10*3/uL (ref 4.0–10.5)
nRBC: 0 % (ref 0.0–0.2)

## 2020-02-24 LAB — URINALYSIS, MICROSCOPIC (REFLEX)

## 2020-02-24 LAB — COMPREHENSIVE METABOLIC PANEL
ALT: 23 U/L (ref 0–44)
AST: 18 U/L (ref 15–41)
Albumin: 4.4 g/dL (ref 3.5–5.0)
Alkaline Phosphatase: 53 U/L (ref 38–126)
Anion gap: 11 (ref 5–15)
BUN: 15 mg/dL (ref 6–20)
CO2: 25 mmol/L (ref 22–32)
Calcium: 9 mg/dL (ref 8.9–10.3)
Chloride: 102 mmol/L (ref 98–111)
Creatinine, Ser: 0.96 mg/dL (ref 0.44–1.00)
GFR calc Af Amer: >60 mL/min (ref >60)
GFR calc non Af Amer: >60 mL/min (ref >60)
Glucose, Bld: 95 mg/dL (ref 70–99)
Potassium: 3.7 mmol/L (ref 3.5–5.1)
Sodium: 138 mmol/L (ref 135–145)
Total Bilirubin: 0.9 mg/dL (ref 0.3–1.2)
Total Protein: 8 g/dL (ref 6.5–8.1)

## 2020-02-24 LAB — URINALYSIS, ROUTINE W REFLEX MICROSCOPIC
Glucose, UA: NEGATIVE mg/dL
Ketones, ur: 15 mg/dL — AB
Leukocytes,Ua: NEGATIVE
Nitrite: NEGATIVE
Protein, ur: 100 mg/dL — AB
Specific Gravity, Urine: >1.03 — ABNORMAL HIGH (ref 1.005–1.030)
pH: 6 (ref 5.0–8.0)

## 2020-02-24 LAB — LIPASE, BLOOD: Lipase: 20 U/L (ref 11–51)

## 2020-02-24 MED ORDER — SODIUM CHLORIDE 0.9% FLUSH
3.0000 mL | Freq: Once | INTRAVENOUS | Status: DC
  Filled 2020-02-24: qty 3

## 2020-02-24 MED ORDER — FENTANYL CITRATE (PF) 100 MCG/2ML IJ SOLN
50.0000 ug | Freq: Once | INTRAMUSCULAR | Status: AC
  Administered 2020-02-24: 50 ug via INTRAVENOUS
  Filled 2020-02-24: qty 2

## 2020-02-24 MED ORDER — IOHEXOL 300 MG/ML  SOLN
100.0000 mL | Freq: Once | INTRAMUSCULAR | Status: AC | PRN
  Administered 2020-02-24: 100 mL via INTRAVENOUS

## 2020-02-24 MED ORDER — HYDROCODONE-ACETAMINOPHEN 5-325 MG PO TABS: 1.0000 | ORAL_TABLET | Freq: Four times a day (QID) | ORAL | 0 refills | Status: DC | PRN

## 2020-02-24 NOTE — ED Triage Notes (Signed)
Pt c/o right side abd pain x 5 days-SOB x 2 days-NAD-slow gait

## 2020-02-24 NOTE — Discharge Instructions (Signed)
Follow-up with the gastroenterologist for the hemangioma.  Take the pain medicine as needed.  You can get in with [facility] as planned but hopefully they can get you in sooner.  Otherwise he could try and try the [facility].

## 2020-02-24 NOTE — ED Provider Notes (Signed)
[facility] EMERGENCY DEPARTMENT Provider Note   CSN: [id number] Arrival date & time: 02/24/20  2032     History Chief Complaint  Patient presents with  . Abdominal Pain    Caitlin Crane is a 44 y.o. female.  HPI Patient presents with abdominal pain.  Epigastric to right upper quadrant.  Has had for around the last 5 days.  Worse with eating but also states it feels as if she gets full earlier.  Pain is become severe.  Worse with movements.  Worse with running.  States that when she runs the pain hurts.  States she been running for weeks before this however without the pain.  Patient has known liver hemangiomas.  States she has an appointment to see [facility] but it is in over a month.    Past Medical History:  Diagnosis Date  . Liver hemangioma     Patient Active Problem List   Diagnosis Date Noted  . S/P appendectomy 07/20/2017  . Torsion of right fallopian tube 07/20/2017    Past Surgical History:  Procedure Laterality Date  . ABDOMINAL HYSTERECTOMY    . BREAST SURGERY    . LAPAROSCOPIC APPENDECTOMY Right 07/20/2017   Procedure: APPENDECTOMY LAPAROSCOPIC, RIGHT SALPINGECTOMY;  Surgeon: Alphonsa Overall, MD;  Location: [facility];  Service: General;  Laterality: Right;     OB History   No obstetric history on file.     No family history on file.  Social History   Tobacco Use  . Smoking status: Never Smoker  . Smokeless tobacco: Never Used  Substance Use Topics  . Alcohol use: No  . Drug use: No    Home Medications Prior to Admission medications   Medication Sig Start Date End Date Taking? Authorizing Provider  HYDROcodone-acetaminophen (NORCO/VICODIN) 5-325 MG tablet Take 1-2 tablets by mouth every 6 (six) hours as needed. 02/24/20   Davonna Belling, MD  naproxen sodium (ANAPROX) 220 MG tablet Take 220 mg by mouth daily as needed (pain).    [provider]    Allergies    Patient has no known allergies.  Review of Systems   Review  of Systems  Constitutional: Positive for appetite change.  HENT: Negative for congestion.   Respiratory: Positive for shortness of breath.   Gastrointestinal: Positive for abdominal pain and nausea.  Genitourinary: Negative for flank pain.  Musculoskeletal: Negative for back pain.  Skin: Negative for rash.  Neurological: Negative for weakness.  Psychiatric/Behavioral: Negative for confusion.    Physical Exam Updated Vital Signs BP (!) 145/79 (BP Location: Right Arm)   Pulse 69   Temp 98.5 F (36.9 C) (Oral)   Resp 20   Ht 5\' 4"  (1.626 m)   Wt 91.6 kg   SpO2 98%   BMI 34.67 kg/m   Physical Exam Vitals reviewed.  HENT:     Head: Normocephalic.  Cardiovascular:     Rate and Rhythm: Regular rhythm.  Pulmonary:     Breath sounds: Normal breath sounds.  Abdominal:     Tenderness: There is abdominal tenderness.     Hernia: No hernia is present.     Comments: Tenderness in epigastric area and right upper quadrant with some fullness.  No rebound or guarding.  Skin:    General: Skin is warm.     Capillary Refill: Capillary refill takes less than 2 seconds.  Neurological:     Mental Status: She is alert and oriented to person, place, and time.     ED Results /  Procedures / Treatments   Labs (all labs ordered are listed, but only abnormal results are displayed) Labs Reviewed  URINALYSIS, ROUTINE W REFLEX MICROSCOPIC - Abnormal; Notable for the following components:      Result Value   APPearance CLOUDY (*)    Specific Gravity, Urine >1.030 (*)    Hgb urine dipstick MODERATE (*)    Bilirubin Urine SMALL (*)    Ketones, ur 15 (*)    Protein, ur 100 (*)    All other components within normal limits  URINALYSIS, MICROSCOPIC (REFLEX) - Abnormal; Notable for the following components:   Bacteria, UA MANY (*)    All other components within normal limits  LIPASE, BLOOD  COMPREHENSIVE METABOLIC PANEL  CBC    EKG None  Radiology CT ABDOMEN PELVIS W CONTRAST  Result  Date: 02/24/2020 CLINICAL DATA:  44 year old female with right side abdominal pain for 5 days. Shortness of breath. EXAM: CT ABDOMEN AND PELVIS WITH CONTRAST TECHNIQUE: Multidetector CT imaging of the abdomen and pelvis was performed using the standard protocol following bolus administration of intravenous contrast. CONTRAST:  15mL OMNIPAQUE IOHEXOL 300 MG/ML  SOLN COMPARISON:  CT Abdomen and Pelvis 07/20/2017. FINDINGS: Lower chest: No cardiomegaly or pericardial effusion. No pleural effusion. Small gastric hiatal hernia. Negative lung bases. Hepatobiliary: 3 chronic liver hemangiomas have increased in size since 2018. The largest which expands the left hepatic lobe now (series 2, image 33) has increased from 12 to 14 cm diameter. A moderate size right lobe hemangioma has increased from 6.3 to 8.2 cm diameter. And an exophytic hemangioma located anterior to the gallbladder fossa has increased from 3 to 4.6 cm diameter. These demonstrate peripheral nodular progressive enhancement from arterial to delayed phase images as expected. No perihepatic free fluid or hemorrhage is identified. There is associated architectural distortion on the liver parenchyma, especially the left lobe. The gallbladder remains normal. No bile duct enlargement is identified. Pancreas: Negative. Spleen: Negative. Adrenals/Urinary Tract: Normal adrenal glands. Bilateral renal enhancement and contrast excretion is symmetric and normal. Normal proximal ureters. No nephrolithiasis identified. Diminutive and unremarkable urinary bladder. Chronic pelvic phleboliths. Stomach/Bowel: Diverticulosis of the transverse colon. Sequelae of appendectomy on series 2, image 96. No large bowel inflammation. No dilated large or small bowel. Small hiatal hernia but the intra-abdominal stomach and small bowel remain within normal limits. No free air. No free fluid. Vascular/Lymphatic: Major arterial structures are patent and appear normal. Contrast timing is  prior to the portal venous system today, but on delayed images the main portal vein appears to be patent. Superimposed architectural distortion on the intrahepatic portal veins. No lymphadenopathy. Reproductive: Surgically absent uterus. Both ovaries appear normal today. Other: No pelvic free fluid. Musculoskeletal: Negative. IMPRESSION: 1. Three chronic Liver Hemangiomas have enlarged since 2018 and now bulge the liver contour. The largest is now 14 cm diameter and expands the left hepatic lobe. However, no evidence of acute hemorrhage is identified. 2. Otherwise largely negative abdomen and pelvis. Diverticulosis of the transverse colon and small gastric hernia. Electronically Signed   By: Genevie Ann M.D.   On: 02/24/2020 23:34    Procedures Procedures (including critical care time)  Medications Ordered in ED Medications  sodium chloride flush (NS) 0.9 % injection 3 mL ( Intravenous Canceled Entry 02/24/20 2144)  fentaNYL (SUBLIMAZE) injection 50 mcg (50 mcg Intravenous Given 02/24/20 2243)  iohexol (OMNIPAQUE) 300 MG/ML solution 100 mL (100 mLs Intravenous Contrast Given 02/24/20 2304)    ED Course  I have reviewed the triage  vital signs and the nursing notes.  Pertinent labs & imaging results that were available during my care of the patient were reviewed by me and considered in my medical decision making (see chart for details).    MDM Rules/Calculators/A&P                      Patient presents with abdominal pain.  Known hemangiomas.  Hemangiomas have increased in size and I think likely the cause of the pain.  The feeling full early could be due to mass-effect from the hemangiomas.  Has appointment with [facility] but is in over a month.  We will see with [facility] if she can get in sooner she will also call of our GI if they can schedule her earlier also.  However I think patient is stable for discharge home and will be discharged with some pain medicine. Final Clinical Impression(s) / ED  Diagnoses Final diagnoses:  Liver hemangioma    Rx / DC Orders ED Discharge Orders         Ordered    HYDROcodone-acetaminophen (NORCO/VICODIN) 5-325 MG tablet  Every 6 hours PRN     02/24/20 2356           Davonna Belling, MD 02/25/20 0007

## 2020-02-26 ENCOUNTER — Encounter (HOSPITAL_COMMUNITY): Payer: Self-pay | Admitting: *Deleted

## 2020-02-26 ENCOUNTER — Emergency Department (HOSPITAL_COMMUNITY)
Admission: EM | Admit: 2020-02-26 | Discharge: 2020-02-27 | Disposition: A | Discharge status: Home / Self Care | Payer: 59 | Attending: Emergency Medicine | Admitting: Emergency Medicine

## 2020-02-26 ENCOUNTER — Other Ambulatory Visit: Payer: Self-pay

## 2020-02-26 ENCOUNTER — Emergency Department (HOSPITAL_COMMUNITY): Payer: 59

## 2020-02-26 DIAGNOSIS — R42 Dizziness and giddiness: Secondary | ICD-10-CM | POA: Insufficient documentation

## 2020-02-26 DIAGNOSIS — R0602 Shortness of breath: Secondary | ICD-10-CM | POA: Diagnosis not present

## 2020-02-26 DIAGNOSIS — R072 Precordial pain: Secondary | ICD-10-CM | POA: Diagnosis not present

## 2020-02-26 DIAGNOSIS — R0789 Other chest pain: Secondary | ICD-10-CM | POA: Diagnosis present

## 2020-02-26 LAB — I-STAT BETA HCG BLOOD, ED (MC, WL, AP ONLY): I-stat hCG, quantitative: <5 m[IU]/mL (ref <5)

## 2020-02-26 MED ORDER — SODIUM CHLORIDE 0.9% FLUSH: 3.0000 mL | Freq: Once | INTRAVENOUS | Status: DC

## 2020-02-26 NOTE — ED Triage Notes (Signed)
Pt reporting two hours she had onset of chest pain radiates to the mid upper back, associated with SOB

## 2020-02-27 LAB — CBC
HCT: 38.2 % (ref 36.0–46.0)
Hemoglobin: 12 g/dL (ref 12.0–15.0)
MCH: 27 pg (ref 26.0–34.0)
MCHC: 31.4 g/dL (ref 30.0–36.0)
MCV: 85.8 fL (ref 80.0–100.0)
Platelets: 335 10*3/uL (ref 150–400)
RBC: 4.45 MIL/uL (ref 3.87–5.11)
RDW: 13.1 % (ref 11.5–15.5)
WBC: 5.8 10*3/uL (ref 4.0–10.5)
nRBC: 0 % (ref 0.0–0.2)

## 2020-02-27 LAB — TROPONIN I (HIGH SENSITIVITY)
Troponin I (High Sensitivity): 4 ng/L (ref <18)
Troponin I (High Sensitivity): 4 ng/L (ref <18)

## 2020-02-27 LAB — BASIC METABOLIC PANEL
Anion gap: 6 (ref 5–15)
BUN: 17 mg/dL (ref 6–20)
CO2: 28 mmol/L (ref 22–32)
Calcium: 8.8 mg/dL — ABNORMAL LOW (ref 8.9–10.3)
Chloride: 104 mmol/L (ref 98–111)
Creatinine, Ser: 0.81 mg/dL (ref 0.44–1.00)
GFR calc Af Amer: >60 mL/min (ref >60)
GFR calc non Af Amer: >60 mL/min (ref >60)
Glucose, Bld: 102 mg/dL — ABNORMAL HIGH (ref 70–99)
Potassium: 3.6 mmol/L (ref 3.5–5.1)
Sodium: 138 mmol/L (ref 135–145)

## 2020-02-27 NOTE — Discharge Instructions (Addendum)

## 2020-02-27 NOTE — ED Provider Notes (Signed)
[facility] DEPT Provider Note   CSN: [id number] Arrival date & time: 02/26/20  2250     History Chief Complaint - chest pain  Janeli L Gerber is a 44 y.o. female.  The history is provided by the patient.    HPI: A 44 year old patient presents for evaluation of chest pain. Initial onset of pain was approximately 3-6 hours ago. The patient's chest pain is described as heaviness/pressure/tightness and is not worse with exertion. The patient complains of nausea. The patient's chest pain is middle- or left-sided, is not well-localized, is not sharp and does radiate to the arms/jaw/neck. The patient denies diaphoresis. The patient has a family history of coronary artery disease in a first-degree relative with onset less than age 10. The patient has no history of stroke, has no history of peripheral artery disease, has not smoked in the past 90 days, denies any history of treated diabetes, is not hypertensive, has no history of hypercholesterolemia and does not have an elevated BMI (>=30).   Patient reports onset of neck, upper chest pressure and squeezing several hours ago.  It lasted at least 2 hours.  She also had the same sensation in her back.  She reports feeling lightheaded and short of breath.  No vomiting or diaphoresis.  No fevers.  Resolved spontaneously.  She has never had this pain before.  She usually does not get reflux  She is otherwise healthy.  She is a non-smoker.  She is very active and runs frequently.  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  That has caused some abdominal pain with recent ER visit  Past Medical History:  Diagnosis Date  . Liver hemangioma     Patient Active Problem List   Diagnosis Date Noted  . S/P appendectomy 07/20/2017  . Torsion of right fallopian tube 07/20/2017    Past Surgical History:  Procedure Laterality Date  . ABDOMINAL HYSTERECTOMY    . BREAST SURGERY    . LAPAROSCOPIC  APPENDECTOMY Right 07/20/2017   Procedure: APPENDECTOMY LAPAROSCOPIC, RIGHT SALPINGECTOMY;  Surgeon: Alphonsa Overall, MD;  Location: [facility];  Service: General;  Laterality: Right;     OB History   No obstetric history on file.     No family history on file.  Social History   Tobacco Use  . Smoking status: Never Smoker  . Smokeless tobacco: Never Used  Substance Use Topics  . Alcohol use: No  . Drug use: No    Home Medications Prior to Admission medications   Medication Sig Start Date End Date Taking? Authorizing Provider  HYDROcodone-acetaminophen (NORCO/VICODIN) 5-325 MG tablet Take 1-2 tablets by mouth every 6 (six) hours as needed. 02/24/20   Davonna Belling, MD    Allergies    Patient has no known allergies.  Review of Systems   Review of Systems  Constitutional: Negative for diaphoresis and fever.  Respiratory: Positive for shortness of breath.   Cardiovascular: Positive for chest pain.  Gastrointestinal: Negative for vomiting.  Neurological: Positive for light-headedness. Negative for weakness and numbness.  All other systems reviewed and are negative.   Physical Exam Updated Vital Signs BP (!) 145/88   Pulse 71   Temp 98.2 F (36.8 C) (Oral)   Resp 14   SpO2 99%   Physical Exam CONSTITUTIONAL: Well developed/well nourished HEAD: Normocephalic/atraumatic EYES: EOMI/PERRL ENMT: Mucous membranes moist NECK: supple no meningeal signs SPINE/BACK:entire spine nontender CV: S1/S2 noted, no murmurs/rubs/gallops noted LUNGS: Lungs are clear to auscultation bilaterally,  no apparent distress ABDOMEN: soft, nontender, no rebound or guarding, bowel sounds noted throughout abdomen GU:no cva tenderness NEURO: Pt is awake/alert/appropriate, moves all extremitiesx4.  No facial droop.  EXTREMITIES: pulses normal/equalx4, full ROM, no calf tenderness SKIN: warm, color normal PSYCH: no abnormalities of mood noted, alert and oriented to situation  ED Results /  Procedures / Treatments   Labs (all labs ordered are listed, but only abnormal results are displayed) Labs Reviewed  BASIC METABOLIC PANEL - Abnormal; Notable for the following components:      Result Value   Glucose, Bld 102 (*)    Calcium 8.8 (*)    All other components within normal limits  CBC  I-STAT BETA HCG BLOOD, ED ([facility], [facility], [facility] ONLY)  TROPONIN I (HIGH SENSITIVITY)  TROPONIN I (HIGH SENSITIVITY)    EKG  ED ECG REPORT   Date: 02/26/2020  2300  Rate: 68  Rhythm: normal sinus rhythm  QRS Axis: normal  Intervals: normal  ST/T Wave abnormalities: nonspecific T wave changes  Conduction Disutrbances:none  Narrative Interpretation:   Old EKG Reviewed: unchanged  I have personally reviewed the EKG tracing and agree with the computerized printout as noted.  Radiology DG Chest 2 View  Result Date: 02/26/2020 CLINICAL DATA:  Initial evaluation for acute chest pain. EXAM: CHEST - 2 VIEW COMPARISON:  Prior radiograph from 04/17/2017. FINDINGS: The cardiac and mediastinal silhouettes are stable in size and contour, and remain within normal limits. The lungs are normally inflated. No airspace consolidation, pleural effusion, or pulmonary edema. No pneumothorax. No acute osseous abnormality. IMPRESSION: No active cardiopulmonary disease. Electronically Signed   By: Jeannine Boga M.D.   On: 02/26/2020 23:27    Procedures Procedures  Medications Ordered in ED Medications  sodium chloride flush (NS) 0.9 % injection 3 mL (0 mLs Intravenous Hold 02/26/20 2345)    ED Course  I have reviewed the triage vital signs and the nursing notes.  Pertinent labs & imaging results that were available during my care of the patient were reviewed by me and considered in my medical decision making (see chart for details).    MDM Rules/Calculators/A&P HEAR Score: 3                    Patient presents for chest and upper back pain that felt like squeezing in nature.  It lasted approximately 2  hours. She is now back to baseline.  She is well-appearing.  She appears PERC negative, PE unlikely.  Troponin is unremarkable in the setting of a heart score 3.  No acute EKG changes.  I have low suspicion for ACS.  No other signs or symptoms to suggest acute aortic dissection. Patient has no new abdominal pain/tenderness I feel she is appropriate for discharge home.  She is already working on gastroenterology follow-up for her liver hemangioma. Patient is very active and I encouraged her to continue her activity. We discussed strict return precautions   This patient presents to the ED for concern of chest pain, this involves an extensive number of treatment options, and is a complaint that carries with it a high risk of complications and morbidity.  The differential diagnosis includes ACS, pulmonary embolism, aortic dissection, GERD, pericarditis   Lab Tests:   I Ordered, reviewed, and interpreted labs, which included troponin, electrolytes, complete blood count    Imaging Studies ordered:   I ordered imaging studies which included chest x-ray   I independently visualized and interpreted imaging which showed negative, no  acute findings  Additional history obtained:    Previous records obtained and reviewed     Final Clinical Impression(s) / ED Diagnoses Final diagnoses:  Precordial pain    Rx / DC Orders ED Discharge Orders    None       Ripley Fraise, MD 02/27/20 [phone]

## 2020-04-02 ENCOUNTER — Encounter: Payer: Self-pay | Admitting: Gastroenterology

## 2020-05-17 ENCOUNTER — Ambulatory Visit: Payer: Self-pay

## 2020-06-01 ENCOUNTER — Ambulatory Visit (INDEPENDENT_AMBULATORY_CARE_PROVIDER_SITE_OTHER): Payer: 59 | Admitting: Gastroenterology

## 2020-06-01 ENCOUNTER — Encounter: Payer: Self-pay | Admitting: Gastroenterology

## 2020-06-01 VITALS — BP 138/82 | HR 82 | Ht 65.0 in | Wt 198.4 lb

## 2020-06-01 DIAGNOSIS — D1803 Hemangioma of intra-abdominal structures: Secondary | ICD-10-CM | POA: Diagnosis not present

## 2020-06-01 NOTE — Progress Notes (Signed)
Referring Provider: Roque Cash., MD Primary Care Physician:  Lin Landsman, MD  Reason for Consultation:  Hemangioma   IMPRESSION:  Abdominal pain - recently intensified requiring ER visit x 2 Hepatic hemangioma - increasing in size No prior colon cancer screening  I have personally reviewed her CT scan results. Imaging shows increasing in size of the hemangiomas since 2002 and 2018. There is no portal vein thrombus or pneumobilia. Symptoms may be related to increasing hemangiomas. Will need to get involvement from a multidisciplinary liver transplant team. No evidence for underlying liver disease.   PLAN: Referral to [facility] Transplant Surgery/Hepatology Colonoscopy for colon cancer screening at her convenience  Please see the "Patient Instructions" section for addition details about the plan.  HPI: Caitlin Crane is a 44 y.o. female referred by the ED for further evaluation of abdominal pain. The history is obtained through the patient and review of her electronic health record. She has a history of GERD and prior appendectomy. Works in [employer] for [employer].   Two <1cm possible hepatic hemangiomas noted on CT scan 12/03/2000. Noted to be 10.6 x 9.8 in the left hepatic lobe and 6.2 x 5.8 in the right hepatic lobe on CT scan 2018.   Seen in the ER for several days of escalting abdominal pain. CT at that time on  02/24/20 showed that the lesions had increased in size to 14 cm in the left lobe and 8.2 cm in the right lobe.  Liver enzymes normal 02/24/20 with AST 18, ALT 23, alk phos 53, TB 0.9.  Pain described as cramping. Associated light-headedness, full/bloated, early satiety. Worsened with eating.  No nausea.  She is able to exercise despite the recent symptoms - including running and [place].   No prior colon cancer or colon cancer screening.   Maternal uncle with colon cancer in his 78s. No known family history of colon cancer or polyps. No family history of  uterine/endometrial cancer, pancreatic cancer or gastric/stomach cancer.   Past Medical History:  Diagnosis Date  . Adrenal nodule ([place])   . Liver hemangioma     Past Surgical History:  Procedure Laterality Date  . ABDOMINAL HYSTERECTOMY    . APPENDECTOMY    . BILATERAL OOPHORECTOMY    . BREAST SURGERY    . LAPAROSCOPIC APPENDECTOMY Right 07/20/2017   Procedure: APPENDECTOMY LAPAROSCOPIC, RIGHT SALPINGECTOMY;  Surgeon: Alphonsa Overall, MD;  Location: [facility];  Service: General;  Laterality: Right;    Current Outpatient Medications  Medication Sig Dispense Refill  . HYDROcodone-acetaminophen (NORCO/VICODIN) 5-325 MG tablet Take 1-2 tablets by mouth every 6 (six) hours as needed. 8 tablet 0   No current facility-administered medications for this visit.    Allergies as of 06/01/2020  . (No Known Allergies)    Family History  Problem Relation Age of Onset  . Heart murmur Mother   . Hypertension Mother   . Hyperlipidemia Mother   . Lung cancer Sister   . Bone cancer Maternal Aunt   . Lung cancer Maternal Grandmother   . Stomach cancer Maternal Uncle   . Hyperlipidemia Maternal Uncle   . Diabetes Cousin   . Breast cancer Cousin   . Colon cancer Neg Hx   . Pancreatic cancer Neg Hx   . Esophageal cancer Neg Hx     Social History   Socioeconomic History  . Marital status: Single    Spouse name: Not on file  . Number of children: Not on file  . Years of  education: Not on file  . Highest education level: Not on file  Occupational History  . Not on file  Tobacco Use  . Smoking status: Never Smoker  . Smokeless tobacco: Never Used  Vaping Use  . Vaping Use: Never used  Substance and Sexual Activity  . Alcohol use: No  . Drug use: No  . Sexual activity: Not on file  Other Topics Concern  . Not on file  Social History Narrative  . Not on file   Social Determinants of Health   Financial Resource Strain:   . Difficulty of Paying Living Expenses: Not on file    Food Insecurity:   . Worried About Charity fundraiser in the Last Year: Not on file  . Ran Out of Food in the Last Year: Not on file  Transportation Needs:   . Lack of Transportation (Medical): Not on file  . Lack of Transportation (Non-Medical): Not on file  Physical Activity:   . Days of Exercise per Week: Not on file  . Minutes of Exercise per Session: Not on file  Stress:   . Feeling of Stress : Not on file  Social Connections:   . Frequency of Communication with Friends and Family: Not on file  . Frequency of Social Gatherings with Friends and Family: Not on file  . Attends Religious Services: Not on file  . Active Member of Clubs or Organizations: Not on file  . Attends Archivist Meetings: Not on file  . Marital Status: Not on file  Intimate Partner Violence:   . Fear of Current or Ex-Partner: Not on file  . Emotionally Abused: Not on file  . Physically Abused: Not on file  . Sexually Abused: Not on file    Review of Systems: 12 system ROS is negative except as noted above.   Physical Exam: General:   Alert,  well-nourished, pleasant and cooperative in NAD Head:  Normocephalic and atraumatic. Eyes:  Sclera clear, no icterus.   Conjunctiva pink. Ears:  Normal auditory acuity. Nose:  No deformity, discharge,  or lesions. Mouth:  No deformity or lesions.   Neck:  Supple; no masses or thyromegaly. Lungs:  Clear throughout to auscultation.   No wheezes. Heart:  Regular rate and rhythm; no murmurs. Abdomen:  Soft, nontender, nondistended, normal bowel sounds, no rebound or guarding. Liver edge is palpable 4 fingerbreaths below the costal margin in the midclavicular line.  Rectal:  Deferred  Msk:  Symmetrical. No boney deformities LAD: No inguinal or umbilical LAD Extremities:  No clubbing or edema. Neurologic:  Alert and  oriented x4;  grossly nonfocal Skin:  Intact without significant lesions or rashes. No spider angioma or palmar erythema. Psych:  Alert  and cooperative. Normal mood and affect.    Rafiel Mecca L. Tarri Glenn, MD, MPH 06/01/2020, 8:18 AM

## 2020-06-01 NOTE — Patient Instructions (Addendum)
If you are age 45 or older, your body mass index should be between 23-30. Your Body mass index is 33.01 kg/m. If this is out of the aforementioned range listed, please consider follow up with your Primary Care Provider.  If you are age 55 or younger, your body mass index should be between 19-25. Your Body mass index is 33.01 kg/m. If this is out of the aformentioned range listed, please consider follow up with your Primary Care Provider.   I will refer you to [facility] for a multidisciplinary team to evaluate your hemangiomas.  Please seek urgent care for severe abdominal pain that may develop prior to that time.  The [employer] website offers some general information about hemangiomas - but, it honestly sounds like you already know all of the information that you will find there.  Please call me to schedule a colonoscopy for colon cancer screening after your hemangiomas have been fully evaluated.   Thank you for trusting me with your gastrointestinal care!    [place], MD, MPH

## 2020-06-26 ENCOUNTER — Ambulatory Visit: Payer: 59 | Attending: Internal Medicine

## 2020-06-26 DIAGNOSIS — Z23 Encounter for immunization: Secondary | ICD-10-CM

## 2020-06-26 MED FILL — PFIZER-BIONTECH COVID-19 VA: 30 | 1 days supply | Qty: 0 | Fill #0

## 2020-06-26 NOTE — Progress Notes (Signed)
   Covid-19 Vaccination Clinic  Name:  Caitlin Crane    MRN: [id number] DOB: 06-04-1976  06/26/2020  [name] was observed post Covid-19 immunization for 15 minutes without incident. She was provided with Vaccine Information Sheet and instruction to access the V-Safe system. Vaccinated By: Doren Custard [employer]  [name] was instructed to call 911 with any severe reactions post vaccine: Marland Kitchen Difficulty breathing  . Swelling of face and throat  . A fast heartbeat  . A bad rash all over body  . Dizziness and weakness   Immunizations Administered    Name Date Dose VIS Date Route   Pfizer COVID-19 Vaccine 06/26/2020 11:33 AM 0.3 mL 11/30/2018 Intramuscular   Manufacturer: [employer], [employer]   Lot: [id number]   NDC: [id number]

## 2020-07-12 ENCOUNTER — Telehealth: Payer: Self-pay | Admitting: Gastroenterology

## 2020-07-12 NOTE — Telephone Encounter (Signed)
Pt was seen 06/01/20 by Dr. Tarri Glenn. Do you recall if you made the referral to [facility] that is mentioned in the [facility] note?

## 2020-07-13 NOTE — Telephone Encounter (Signed)
I would hope so! That was our entire plan at the time of her office visit.

## 2020-07-13 NOTE — Telephone Encounter (Signed)
Referral was to transplant hepatology for the liver lesion. It is a multidisciplinary clinic. No specific provider. Thank you.

## 2020-07-13 NOTE — Telephone Encounter (Signed)
Dr. Tarri Glenn this pt is called to check on her [facility] referral. It mentions a referral in the OV note but cannot see where it was done. Do you have a particular provider you wanted her to see and also please advise.

## 2020-07-17 NOTE — Telephone Encounter (Signed)
Please see communication in [place]. Letter from department also has been scanned in to media.

## 2020-07-17 NOTE — Telephone Encounter (Signed)
Great. Thank you.

## 2020-07-18 NOTE — Telephone Encounter (Signed)
Pt was referred to [facility]. Per letter from [facility] the records will be reviewed to see if the doctors feel she is appropriate for transplant. Phone number for [facility] is [phone]. Left message for pt to call back.

## 2020-07-24 NOTE — Telephone Encounter (Signed)
Have been unable to reach pt by phone. Letter mailed to pt with [facility] referral information.

## 2020-07-24 NOTE — Telephone Encounter (Signed)
I spoke to the transplant referral line. [phone] Referral is still in review. After the finalized review they will set her up for either an appointment for consult or send her to Hepatology or GI if needed.

## 2020-07-31 NOTE — Telephone Encounter (Signed)
Called and Left a detailed message looking for an update on this referral and the patient's appt status.

## 2020-08-21 NOTE — Telephone Encounter (Signed)
Left message to return call with the referral department checking on status

## 2020-08-23 NOTE — Telephone Encounter (Signed)
Patient is scheduled with [facility] on 09/04/20 at 8:40 am (Dr Marcille Buffy & Dr Colonel Bald).

## 2022-01-21 ENCOUNTER — Ambulatory Visit
Admission: EM | Admit: 2022-01-21 | Discharge: 2022-01-21 | Disposition: A | Discharge status: Home / Self Care | Payer: 59 | Attending: Internal Medicine | Admitting: Internal Medicine

## 2022-01-21 DIAGNOSIS — R300 Dysuria: Secondary | ICD-10-CM

## 2022-01-21 LAB — POCT URINALYSIS DIP (MANUAL ENTRY)
Bilirubin, UA: NEGATIVE
Glucose, UA: NEGATIVE mg/dL
Ketones, POC UA: NEGATIVE mg/dL
Leukocytes, UA: NEGATIVE
Nitrite, UA: NEGATIVE
Protein Ur, POC: NEGATIVE mg/dL
Spec Grav, UA: 1.025
Urobilinogen, UA: 0.2 U/dL
pH, UA: 5

## 2022-01-21 NOTE — Discharge Instructions (Signed)
Your urine test was not definitive for urinary tract infection. urine culture and vaginal swab are pending.  We will call if results are positive and treat as appropriate.  Please follow-up if symptoms persist or worsen. ?

## 2022-01-21 NOTE — ED Triage Notes (Signed)
Pt said x 3 weeks had burning and pain with urination. Then would go away and come back. The past 4 days the pain and needle feeling pain has come back. Burning and pain more today. No blood in the urine ?

## 2022-01-21 NOTE — ED Provider Notes (Signed)
?[facility] ? ? ? ?CSN: [id number] ?Arrival date & time: 01/21/22  1806 ? ? ?  ? ?History   ?Chief Complaint ?Chief Complaint  ?Patient presents with  ? Urinary Frequency  ? ? ?HPI ?Caitlin Crane is a 46 y.o. female.  ? ?Patient presents with urinary burning and urinary frequency that has been intermittent over the past 3 weeks.  It has been more persistent over the past few days.  Denies vaginal discharge, abdominal pain, hematuria, back pain, fever, pelvic pain, abnormal vaginal bleeding.  Patient reports that she is currently in menopause and does not have menstrual cycles.  Denies any known exposure or concern for STD.  Denies recurrent urinary tract infections.  Patient reports that she has only had 1 urinary tract infection when she was 46 years old. ? ? ?Urinary Frequency ? ? ?Past Medical History:  ?Diagnosis Date  ? Adrenal nodule ([place])   ? Liver hemangioma   ? ? ?Patient Active Problem List  ? Diagnosis Date Noted  ? S/P appendectomy 07/20/2017  ? Torsion of right fallopian tube 07/20/2017  ? ? ?Past Surgical History:  ?Procedure Laterality Date  ? ABDOMINAL HYSTERECTOMY    ? APPENDECTOMY    ? BILATERAL OOPHORECTOMY    ? BREAST SURGERY    ? LAPAROSCOPIC APPENDECTOMY Right 07/20/2017  ? Procedure: APPENDECTOMY LAPAROSCOPIC, RIGHT SALPINGECTOMY;  Surgeon: Alphonsa Overall, MD;  Location: [facility];  Service: General;  Laterality: Right;  ? ? ?OB History   ?No obstetric history on file. ?  ? ? ? ?Home Medications   ? ?Prior to Admission medications   ?Not on File  ? ? ?Family History ?Family History  ?Problem Relation Age of Onset  ? Heart murmur Mother   ? Hypertension Mother   ? Hyperlipidemia Mother   ? Lung cancer Sister   ? Bone cancer Maternal Aunt   ? Lung cancer Maternal Grandmother   ? Stomach cancer Maternal Uncle   ? Hyperlipidemia Maternal Uncle   ? Diabetes Cousin   ? Breast cancer Cousin   ? Colon cancer Neg Hx   ? Pancreatic cancer Neg Hx   ? Esophageal cancer Neg Hx   ? ? ?Social  History ?Social History  ? ?Tobacco Use  ? Smoking status: Never  ? Smokeless tobacco: Never  ?Vaping Use  ? Vaping Use: Never used  ?Substance Use Topics  ? Alcohol use: No  ? Drug use: No  ? ? ? ?Allergies   ?Patient has no known allergies. ? ? ?Review of Systems ?Review of Systems ?Per HPI ? ?Physical Exam ?Triage Vital Signs ?ED Triage Vitals [01/21/22 1845]  ?Enc Vitals Group  ?   BP (!) 153/95  ?   Pulse Rate 76  ?   Resp 16  ?   Temp 98.6 ?F (37 ?C)  ?   Temp Source Oral  ?   SpO2 99 %  ?   Weight   ?   Height   ?   Head Circumference   ?   Peak Flow   ?   Pain Score 6  ?   Pain Loc   ?   Pain Edu?   ?   Excl. in [place]?   ? ?No data found. ? ?Updated Vital Signs ?BP (!) 153/95 (BP Location: Left Arm)   Pulse 76   Temp 98.6 ?F (37 ?C) (Oral)   Resp 16   SpO2 99%  ? ?Visual Acuity ?Right Eye Distance:   ?Left  Eye Distance:   ?Bilateral Distance:   ? ?Right Eye Near:   ?Left Eye Near:    ?Bilateral Near:    ? ?Physical Exam ?Constitutional:   ?   General: She is not in acute distress. ?   Appearance: Normal appearance. She is not toxic-appearing or diaphoretic.  ?HENT:  ?   Head: Normocephalic and atraumatic.  ?Eyes:  ?   Extraocular Movements: Extraocular movements intact.  ?   Conjunctiva/sclera: Conjunctivae normal.  ?Cardiovascular:  ?   Rate and Rhythm: Normal rate and regular rhythm.  ?   Pulses: Normal pulses.  ?   Heart sounds: Normal heart sounds.  ?Pulmonary:  ?   Effort: Pulmonary effort is normal. No respiratory distress.  ?   Breath sounds: Normal breath sounds.  ?Abdominal:  ?   General: Bowel sounds are normal. There is no distension.  ?   Palpations: Abdomen is soft.  ?   Tenderness: There is no abdominal tenderness.  ?Genitourinary: ?   Comments: Deferred with shared decision making.  Self swab performed. ?Neurological:  ?   General: No focal deficit present.  ?   Mental Status: She is alert and oriented to person, place, and time. Mental status is at baseline.  ?Psychiatric:     ?   Mood and  Affect: Mood normal.     ?   Behavior: Behavior normal.     ?   Thought Content: Thought content normal.     ?   Judgment: Judgment normal.  ? ? ? ?UC Treatments / Results  ?Labs ?(all labs ordered are listed, but only abnormal results are displayed) ?Labs Reviewed  ?POCT URINALYSIS DIP (MANUAL ENTRY) - Abnormal; Notable for the following components:  ?    Result Value  ? Color, UA straw (*)   ? Clarity, UA cloudy (*)   ? Blood, UA moderate (*)   ? All other components within normal limits  ?URINE CULTURE  ?CERVICOVAGINAL ANCILLARY ONLY  ? ? ?EKG ? ? ?Radiology ?No results found. ? ?Procedures ?Procedures (including critical care time) ? ?Medications Ordered in UC ?Medications - No data to display ? ?Initial Impression / Assessment and Plan / UC Course  ?I have reviewed the triage vital signs and the nursing notes. ? ?Pertinent labs & imaging results that were available during my care of the patient were reviewed by me and considered in my medical decision making (see chart for details). ? ?  ? ?Urinalysis not indicating urinary tract infection at this time.  Urine culture pending.  Cervicovaginal swab pending to rule out vaginitis as cause of symptoms.  Will await results for any further treatment.  Discussed strict return and ER precautions.  Patient verbalized understanding and was agreeable with plan. ?Final Clinical Impressions(s) / UC Diagnoses  ? ?Final diagnoses:  ?Dysuria  ? ? ? ?Discharge Instructions   ? ?  ?Your urine test was not definitive for urinary tract infection. urine culture and vaginal swab are pending.  We will call if results are positive and treat as appropriate.  Please follow-up if symptoms persist or worsen. ? ? ? ?ED Prescriptions   ?None ?  ? ?PDMP not reviewed this encounter. ?  ?Teodora Medici, [place] ?01/21/22 1859 ? ?

## 2022-01-23 LAB — URINE CULTURE

## 2022-01-23 LAB — CERVICOVAGINAL ANCILLARY ONLY
Bacterial Vaginitis (gardnerella): NEGATIVE
Candida Glabrata: NEGATIVE
Candida Vaginitis: NEGATIVE
Comment: NEGATIVE
Comment: NEGATIVE
Comment: NEGATIVE

## 2024-02-22 ENCOUNTER — Encounter: Payer: Self-pay | Admitting: Emergency Medicine

## 2024-02-22 ENCOUNTER — Ambulatory Visit
Admission: EM | Admit: 2024-02-22 | Discharge: 2024-02-22 | Disposition: A | Discharge status: Home / Self Care | Attending: Internal Medicine | Admitting: Internal Medicine

## 2024-02-22 DIAGNOSIS — H6993 Unspecified Eustachian tube disorder, bilateral: Secondary | ICD-10-CM

## 2024-02-22 DIAGNOSIS — R42 Dizziness and giddiness: Secondary | ICD-10-CM | POA: Diagnosis not present

## 2024-02-22 LAB — POCT FASTING CBG KUC MANUAL ENTRY: POCT Glucose (KUC): 99 mg/dL (ref 70–99)

## 2024-02-22 MED ORDER — MECLIZINE HCL 12.5 MG PO TABS: 12.5000 mg | ORAL_TABLET | Freq: Three times a day (TID) | ORAL | 0 refills | Status: AC | PRN

## 2024-02-22 NOTE — ED Triage Notes (Signed)
 Pt c/o bilateral ear pain and sinus pressure since Friday. States she had episode on Saturday when she bent over and was dizzy and off balance.

## 2024-02-22 NOTE — ED Provider Notes (Signed)
 Caitlin Crane    CSN: [id number] Arrival date & time: 02/22/24  1918      History   Chief Complaint Chief Complaint  Patient presents with  . Otalgia    HPI Caitlin Crane is a 48 y.o. female.   Caitlin Crane is a 48 y.o. female presenting for chief complaint of Otalgia.   Fasting today 1-2 cups of bone broth She has a low hunger drive and osmetimes forgest to eat She has not noticed if eating makes this better or not No recent nasal congestion Ears feel "full" every morning No more than 2 meals per day Drinks lots of water No diarrhea or vomiting Regular stools  Fasts intermittently   She became dizzy with standing up from using the bathroom Denies urinary symptoms No blood or mucous in the stools  She denies history of stroke or allergies No chest pain or sob   Otalgia   Past Medical History:  Diagnosis Date  . Adrenal nodule (HCC)   . Liver hemangioma     Patient Active Problem List   Diagnosis Date Noted  . S/P appendectomy 07/20/2017  . Torsion of right fallopian tube 07/20/2017    Past Surgical History:  Procedure Laterality Date  . ABDOMINAL HYSTERECTOMY    . APPENDECTOMY    . BILATERAL OOPHORECTOMY    . BREAST SURGERY    . LAPAROSCOPIC APPENDECTOMY Right 07/20/2017   Procedure: APPENDECTOMY LAPAROSCOPIC, RIGHT SALPINGECTOMY;  Surgeon: Juanita Norlander, MD;  Location: [facility];  Service: General;  Laterality: Right;    OB History   No obstetric history on file.      Home Medications    Prior to Admission medications   Not on File    Family History Family History  Problem Relation Age of Onset  . Heart murmur Mother   . Hypertension Mother   . Hyperlipidemia Mother   . Lung cancer Sister   . Bone cancer Maternal Aunt   . Lung cancer Maternal Grandmother   . Stomach cancer Maternal Uncle   . Hyperlipidemia Maternal Uncle   . Diabetes Cousin   . Breast cancer Cousin   . Colon cancer Neg Hx   . Pancreatic  cancer Neg Hx   . Esophageal cancer Neg Hx     Social History Social History   Tobacco Use  . Smoking status: Never  . Smokeless tobacco: Never  Vaping Use  . Vaping status: Never Used  Substance Use Topics  . Alcohol use: No  . Drug use: No     Allergies   Patient has no known allergies.   Review of Systems Review of Systems  HENT:  Positive for ear pain.      Physical Exam Triage Vital Signs ED Triage Vitals  Encounter Vitals Group     BP 02/22/24 1928 (!) 156/99     Systolic BP Percentile --      Diastolic BP Percentile --      Pulse Rate 02/22/24 1928 91     Resp 02/22/24 1928 17     Temp 02/22/24 1928 98.3 F (36.8 C)     Temp Source 02/22/24 1928 Oral     SpO2 02/22/24 1928 95 %     Weight --      Height --      Head Circumference --      Peak Flow --      Pain Score 02/22/24 1933 6     Pain Loc --  Pain Education --      Exclude from Growth Chart --    No data found.  Updated Vital Signs BP (!) 156/99 (BP Location: Right Arm)   Pulse 91   Temp 98.3 F (36.8 C) (Oral)   Resp 17   SpO2 95%   Visual Acuity Right Eye Distance:   Left Eye Distance:   Bilateral Distance:    Right Eye Near:   Left Eye Near:    Bilateral Near:     Physical Exam   Crane Treatments / Results  Labs (all labs ordered are listed, but only abnormal results are displayed) Labs Reviewed - No data to display  EKG   Radiology No results found.  Procedures Procedures (including critical care time)  Medications Ordered in Crane Medications - No data to display  Initial Impression / Assessment and Plan / Crane Course  I have reviewed the triage vital signs and the nursing notes.  Pertinent labs & imaging results that were available during my care of the patient were reviewed by me and considered in my medical decision making (see chart for details).     *** Final Clinical Impressions(s) / Crane Diagnoses   Final diagnoses:  None   Discharge Instructions    None    ED Prescriptions   None    PDMP not reviewed this encounter.

## 2024-02-22 NOTE — Discharge Instructions (Signed)
 It's unclear what is causing your dizziness. I suspect that you may be experiencing vertigo. Take meclizine  12.5 mg every 8 hours as needed for dizziness, know that this medication may make you sleepy so mostly take this at bedtime and do not go to work when you take this medication.  I have drawn blood work today to evaluate for electrolyte imbalance, kidney function, and anemia which could also be causing dizziness.  Please follow-up with your primary care doctor regarding dizziness and ear discomfort in the next 3 to 4 days.  If your headache worsens, or if you develop visual disturbances/blurry vision, chest pain, shortness of breath, vomiting/diarrhea, or new or worsening symptoms, please go to the nearest emergency room for further workup and evaluation.

## 2024-02-23 ENCOUNTER — Ambulatory Visit (HOSPITAL_COMMUNITY): Payer: Self-pay

## 2024-02-23 LAB — BASIC METABOLIC PANEL WITH GFR
BUN/Creatinine Ratio: 14 (ref 9–23)
BUN: 11 mg/dL (ref 6–24)
CO2: 21 mmol/L (ref 20–29)
Calcium: 9.1 mg/dL (ref 8.7–10.2)
Chloride: 101 mmol/L (ref 96–106)
Creatinine, Ser: 0.8 mg/dL (ref 0.57–1.00)
Glucose: 94 mg/dL (ref 70–99)
Potassium: 3.5 mmol/L (ref 3.5–5.2)
Sodium: 140 mmol/L (ref 134–144)
eGFR: 91 mL/min/{1.73_m2} (ref >59)

## 2024-02-23 LAB — CBC
Hematocrit: 39.7 % (ref 34.0–46.6)
Hemoglobin: 12.5 g/dL (ref 11.1–15.9)
MCH: 26.4 pg — ABNORMAL LOW (ref 26.6–33.0)
MCHC: 31.5 g/dL (ref 31.5–35.7)
MCV: 84 fL (ref 79–97)
Platelets: 364 10*3/uL (ref 150–450)
RBC: 4.74 x10E6/uL (ref 3.77–5.28)
RDW: 13.1 % (ref 11.7–15.4)
WBC: 6.6 10*3/uL (ref 3.4–10.8)
